# Patient Record
Sex: Female | Born: 1980
Health system: Southern US, Community
[De-identification: ages and names within clinical notes are randomized; demographics above are authoritative.]

## PROBLEM LIST (undated history)

## (undated) DIAGNOSIS — O350XX0 Maternal care for (suspected) central nervous system malformation in fetus, not applicable or unspecified: Secondary | ICD-10-CM

## (undated) DIAGNOSIS — G43109 Migraine with aura, not intractable, without status migrainosus: Secondary | ICD-10-CM

## (undated) DIAGNOSIS — G459 Transient cerebral ischemic attack, unspecified: Secondary | ICD-10-CM

## (undated) DIAGNOSIS — E785 Hyperlipidemia, unspecified: Secondary | ICD-10-CM

## (undated) HISTORY — DX: Hyperlipidemia, unspecified: E78.5

## (undated) HISTORY — DX: Migraine with aura, not intractable, without status migrainosus: G43.109

## (undated) HISTORY — DX: Maternal care for (suspected) central nervous system malformation in fetus, not applicable or unspecified: O35.0XX0

## (undated) HISTORY — DX: Transient cerebral ischemic attack, unspecified: G45.9

---
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
## 2005-01-31 ENCOUNTER — Ambulatory Visit (HOSPITAL_COMMUNITY): Admission: RE | Admit: 2005-01-31 | Discharge: 2005-01-31 | Payer: Self-pay | Admitting: *Deleted

## 2005-03-16 ENCOUNTER — Ambulatory Visit (HOSPITAL_COMMUNITY): Admission: RE | Admit: 2005-03-16 | Discharge: 2005-03-16 | Payer: Self-pay | Admitting: *Deleted

## 2005-08-17 ENCOUNTER — Ambulatory Visit: Payer: Self-pay | Admitting: Obstetrics & Gynecology

## 2005-08-22 ENCOUNTER — Ambulatory Visit: Payer: Self-pay | Admitting: Obstetrics and Gynecology

## 2005-08-22 ENCOUNTER — Inpatient Hospital Stay (HOSPITAL_COMMUNITY): Admission: AD | Admit: 2005-08-22 | Discharge: 2005-08-25 | Payer: Self-pay | Admitting: *Deleted

## 2008-05-25 ENCOUNTER — Emergency Department (HOSPITAL_COMMUNITY): Admission: EM | Admit: 2008-05-25 | Discharge: 2008-05-25 | Payer: Self-pay | Admitting: Emergency Medicine

## 2011-09-14 LAB — POCT I-STAT, CHEM 8
BUN: 15
Calcium, Ion: 1.15
Hemoglobin: 12.9
Sodium: 138
TCO2: 23

## 2011-09-14 LAB — POCT PREGNANCY, URINE: Operator id: 247071

## 2012-10-10 ENCOUNTER — Ambulatory Visit (INDEPENDENT_AMBULATORY_CARE_PROVIDER_SITE_OTHER): Payer: BLUE CROSS/BLUE SHIELD | Admitting: Family Medicine

## 2012-10-10 VITALS — BP 124/78 | HR 79 | Temp 98.0°F | Resp 17 | Ht 65.0 in | Wt 124.0 lb

## 2012-10-10 DIAGNOSIS — Z3009 Encounter for other general counseling and advice on contraception: Secondary | ICD-10-CM

## 2012-10-10 MED ORDER — NORETHINDRONE ACET-ETHINYL EST 1-20 MG-MCG PO TABS
1.0000 | ORAL_TABLET | Freq: Every day | ORAL | Status: DC
Start: 2012-10-10 — End: 2013-07-15

## 2012-10-10 NOTE — Progress Notes (Signed)
  Subjective:    Patient ID: Kelsey Anthony, female    DOB: 02-Aug-1981, 31 y.o.   MRN: [id number]  HPI  No h/o abnml paps. Was getting yearly and no h/o abnml so only needs every 3 yrs now as married in a monogamous relationship.  Last pap was 2 yrs ago and [facility]. Needs OCP refill today. No complaints or concerns.  History reviewed. No pertinent past medical history.   Review of Systems  Constitutional: Negative for fever, chills, diaphoresis, activity change, appetite change, fatigue and unexpected weight change.  Respiratory: Negative for shortness of breath.   Cardiovascular: Negative for chest pain and leg swelling.  Genitourinary: Negative for dysuria, vaginal discharge, genital sores, menstrual problem and pelvic pain.  Musculoskeletal: Negative for myalgias and joint swelling.      BP 124/78  Pulse 79  Temp 98 F (36.7 C) (Oral)  Resp 17  Ht 5\' 5"  (1.651 m)  Wt 124 lb (56.246 kg)  BMI 20.63 kg/m2  SpO2 98%  LMP 09/18/2012 Objective:   Physical Exam  Constitutional: She is oriented to person, place, and time. She appears well-developed and well-nourished. No distress.  HENT:  Head: Normocephalic and atraumatic.  Neck: Normal range of motion. Neck supple. No thyromegaly present.  Cardiovascular: Normal rate, regular rhythm and normal heart sounds.   Pulmonary/Chest: Effort normal and breath sounds normal. No respiratory distress.  Lymphadenopathy:    She has no cervical adenopathy.  Neurological: She is alert and oriented to person, place, and time.  Skin: Skin is warm and dry. She is not diaphoretic. No erythema.  Psychiatric: She has a normal mood and affect. Her behavior is normal.      Assessment & Plan:  1. Family planning - doing well on OCPs. Reviewed directions for use and side effects.  Pt will RTC in 1 yr for full CPEx w/ pap.

## 2013-07-15 ENCOUNTER — Encounter (HOSPITAL_COMMUNITY): Payer: Self-pay | Admitting: Emergency Medicine

## 2013-07-15 ENCOUNTER — Observation Stay (HOSPITAL_COMMUNITY)
Admission: EM | Admit: 2013-07-15 | Discharge: 2013-07-16 | Disposition: A | Discharge status: Home / Self Care | Payer: BC Managed Care – PPO | Attending: Internal Medicine | Admitting: Internal Medicine

## 2013-07-15 ENCOUNTER — Emergency Department (HOSPITAL_COMMUNITY): Payer: BC Managed Care – PPO

## 2013-07-15 DIAGNOSIS — H5460 Unqualified visual loss, one eye, unspecified: Secondary | ICD-10-CM | POA: Insufficient documentation

## 2013-07-15 DIAGNOSIS — H539 Unspecified visual disturbance: Secondary | ICD-10-CM

## 2013-07-15 DIAGNOSIS — R200 Anesthesia of skin: Secondary | ICD-10-CM

## 2013-07-15 DIAGNOSIS — H538 Other visual disturbances: Secondary | ICD-10-CM | POA: Insufficient documentation

## 2013-07-15 DIAGNOSIS — R209 Unspecified disturbances of skin sensation: Secondary | ICD-10-CM | POA: Insufficient documentation

## 2013-07-15 DIAGNOSIS — G459 Transient cerebral ischemic attack, unspecified: Principal | ICD-10-CM | POA: Insufficient documentation

## 2013-07-15 DIAGNOSIS — R946 Abnormal results of thyroid function studies: Secondary | ICD-10-CM | POA: Insufficient documentation

## 2013-07-15 DIAGNOSIS — G43109 Migraine with aura, not intractable, without status migrainosus: Secondary | ICD-10-CM | POA: Diagnosis present

## 2013-07-15 LAB — DIFFERENTIAL
Basophils Absolute: 0 10*3/uL (ref 0.0–0.1)
Eosinophils Absolute: 0.1 10*3/uL (ref 0.0–0.7)
Eosinophils Relative: 1 % (ref 0–5)
Lymphocytes Relative: 29 % (ref 12–46)
Lymphs Abs: 1.7 10*3/uL (ref 0.7–4.0)
Neutrophils Relative %: 58 % (ref 43–77)

## 2013-07-15 LAB — POCT I-STAT, CHEM 8
BUN: 9 mg/dL (ref 6–23)
Calcium, Ion: 1.2 mmol/L (ref 1.12–1.23)
Chloride: 107 mEq/L (ref 96–112)
Creatinine, Ser: 0.8 mg/dL (ref 0.50–1.10)

## 2013-07-15 LAB — COMPREHENSIVE METABOLIC PANEL
ALT: 15 U/L (ref 0–35)
AST: 21 U/L (ref 0–37)
Alkaline Phosphatase: 51 U/L (ref 39–117)
Calcium: 9.2 mg/dL (ref 8.4–10.5)
Potassium: 3.7 mEq/L (ref 3.5–5.1)
Sodium: 138 mEq/L (ref 135–145)
Total Protein: 7.3 g/dL (ref 6.0–8.3)

## 2013-07-15 LAB — URINALYSIS, ROUTINE W REFLEX MICROSCOPIC
Ketones, ur: NEGATIVE mg/dL
Leukocytes, UA: NEGATIVE
Nitrite: NEGATIVE
Protein, ur: NEGATIVE mg/dL
Urobilinogen, UA: 1 mg/dL (ref 0.0–1.0)

## 2013-07-15 LAB — POCT PREGNANCY, URINE: Preg Test, Ur: NEGATIVE

## 2013-07-15 LAB — CBC
MCH: 28.4 pg (ref 26.0–34.0)
MCV: 83.1 fL (ref 78.0–100.0)
Platelets: 272 10*3/uL (ref 150–400)
RBC: 4.37 MIL/uL (ref 3.87–5.11)
RDW: 13 % (ref 11.5–15.5)
WBC: 5.7 10*3/uL (ref 4.0–10.5)

## 2013-07-15 NOTE — ED Notes (Signed)
PT. REPORTS BRIEF EPISODE OF RIGHT FACIAL NUMBNESS AND RIGHT EYE BLURRED VISION WHILE DRIVING THIS EVENING , ALERT AND ORIENTED , SPEECH CLEAR , EQUAL STRONG GRIPS , NO ARM DRIFT /AMBULATORY.

## 2013-07-15 NOTE — ED Provider Notes (Signed)
CSN: [id number]     Arrival date & time 07/15/13  2111 History     First MD Initiated Contact with Patient 07/15/13 2240     Chief Complaint  Patient presents with  . Numbness   (Consider location/radiation/quality/duration/timing/severity/associated sxs/prior Treatment) The history is provided by the patient and medical records. No language interpreter was used.    Kelsey Anthony is a 32 y.o. female  with a no medical Hx presents to the Emergency Department complaining of gradual, resolved 30 second episode of right eye blindness with associated facial numbness approximately 7 PM.  Husband states that when he arrived home approximately 7:30 patient was "confused and out of it" with somewhat slurred speech with some stuttering and word finding difficulty.  Patient denies personal or family history of stroke.  Denies any previous episodes similar to this one. Nothing made the symptoms better or worse and they resolved completely and spontaneously just prior to arrival.  Pt denies fever, chills, headache, neck pain chest pain, shortness of breath, abdominal pain, nausea, vomiting, diarrhea, weakness, dizziness, syncope, dysuria, hematuria, weakness.  Denies numbness or tingling in any of her extremities, gait disturbance were noted facial droop.     History reviewed. No pertinent past medical history. History reviewed. No pertinent past surgical history. Family History  Problem Relation Age of Onset  . Depression Mother   . Depression Sister   . Heart disease Maternal Grandmother   . Cancer Maternal Grandfather   . Diabetes Paternal Grandmother    History  Substance Use Topics  . Smoking status: Never Smoker   . Smokeless tobacco: Never Used  . Alcohol Use: 0.6 oz/week    1 Glasses of wine per week   OB History   Grav Para Term Preterm Abortions TAB SAB Ect Mult Living                 Review of Systems  Constitutional: Negative for fever, diaphoresis, appetite change,  fatigue and unexpected weight change.  HENT: Negative for mouth sores and neck stiffness.   Eyes: Positive for visual disturbance.  Respiratory: Negative for cough, chest tightness, shortness of breath and wheezing.   Cardiovascular: Negative for chest pain.  Gastrointestinal: Negative for nausea, vomiting, abdominal pain, diarrhea and constipation.  Endocrine: Negative for polydipsia, polyphagia and polyuria.  Genitourinary: Negative for dysuria, urgency, frequency and hematuria.  Musculoskeletal: Negative for back pain.  Skin: Negative for rash.  Allergic/Immunologic: Negative for immunocompromised state.  Neurological: Positive for numbness. Negative for syncope, light-headedness and headaches.  Hematological: Does not bruise/bleed easily.  Psychiatric/Behavioral: Negative for sleep disturbance. The patient is not nervous/anxious.     Allergies  Amoxicillin and Penicillins  Home Medications   Current Outpatient Rx  Name  Route  Sig  Dispense  Refill  . acetaminophen (TYLENOL) 325 MG tablet   Oral   Take 325 mg by mouth every 6 (six) hours as needed for pain.         Marland Kitchen norethindrone-ethinyl estradiol (JUNEL FE,GILDESS FE,LOESTRIN FE) 1-20 MG-MCG tablet   Oral   Take 1 tablet by mouth daily.          BP 144/83  Pulse 80  Temp(Src) 98 F (36.7 C) (Oral)  Resp 17  SpO2 99% Physical Exam  Nursing note and vitals reviewed. Constitutional: She is oriented to person, place, and time. She appears well-developed and well-nourished. No distress.  Awake, alert, nontoxic appearance  HENT:  Head: Normocephalic and atraumatic.  Right Ear: Hearing, tympanic membrane,  external ear and ear canal normal.  Left Ear: Hearing, tympanic membrane, external ear and ear canal normal.  Nose: Nose normal. No mucosal edema.  Mouth/Throat: Uvula is midline, oropharynx is clear and moist and mucous membranes are normal. Mucous membranes are not dry. No edematous. No oropharyngeal exudate,  posterior oropharyngeal edema, posterior oropharyngeal erythema or tonsillar abscesses.  Eyes: Conjunctivae and EOM are normal. Pupils are equal, round, and reactive to light. Right conjunctiva is not injected. Left conjunctiva is not injected. No scleral icterus.  Fundoscopic exam:      The right eye shows no arteriolar narrowing, no AV nicking, no exudate, no hemorrhage and no papilledema.       The left eye shows no arteriolar narrowing, no AV nicking, no exudate, no hemorrhage and no papilledema.  Neck: Normal range of motion, full passive range of motion without pain and phonation normal. Neck supple. No spinous process tenderness present. No rigidity. Normal range of motion present.  Cardiovascular: Normal rate, regular rhythm, S1 normal, S2 normal, normal heart sounds and intact distal pulses.   No murmur heard. Pulses:      Radial pulses are 2+ on the right side, and 2+ on the left side.       Dorsalis pedis pulses are 2+ on the right side, and 2+ on the left side.       Posterior tibial pulses are 2+ on the right side, and 2+ on the left side.  Pulmonary/Chest: Effort normal and breath sounds normal. No stridor. No respiratory distress. She has no wheezes. She has no rales. She exhibits no tenderness.  Abdominal: Soft. Bowel sounds are normal. She exhibits no distension and no mass. There is no tenderness. There is no rebound and no guarding.  Musculoskeletal: Normal range of motion. She exhibits no edema.  Neurological: She is alert and oriented to person, place, and time. She has normal reflexes. No cranial nerve deficit. She exhibits normal muscle tone. Coordination normal.  Speech is clear and goal oriented, follows commands Major Cranial nerves without deficit, no facial droop Normal strength in upper and lower extremities bilaterally including dorsiflexion and plantar flexion, strong and equal grip strength Sensation normal to light and sharp touch Moves extremities without ataxia,  coordination intact Normal finger to nose and rapid alternating movements Neg romberg, no pronator drift Normal gait and balance  Skin: Skin is warm and dry. No rash noted. She is not diaphoretic. No erythema.  Psychiatric: She has a normal mood and affect.    ED Course   Procedures (including critical care time)  Labs Reviewed  COMPREHENSIVE METABOLIC PANEL - Abnormal; Notable for the following:    Glucose, Bld 119 (*)    Total Bilirubin 0.2 (*)    All other components within normal limits  POCT I-STAT, CHEM 8 - Abnormal; Notable for the following:    Glucose, Bld 120 (*)    All other components within normal limits  CBC  DIFFERENTIAL  URINE RAPID DRUG SCREEN (HOSP PERFORMED)  URINALYSIS, ROUTINE W REFLEX MICROSCOPIC  PROTIME-INR  APTT  POCT PREGNANCY, URINE   Ct Head (brain) Wo Contrast  07/15/2013   *RADIOLOGY REPORT*  Clinical Data: Right facial numbness  CT HEAD WITHOUT CONTRAST  Technique:  Contiguous axial images were obtained from the base of the skull through the vertex without contrast.  Comparison: None.  Findings: The calvarium is intact.  No soft tissue abnormality is seen.  The ventricles are of normal size and configuration. No findings to suggest  acute hemorrhage, acute infarction or space- occupying mass lesion are noted.  IMPRESSION: No acute abnormality is seen.   Original Report Authenticated By: Alcide Clever, M.D.   1. Visual disturbance   2. Numbness and tingling of right face     MDM  Terie P Brower presents with symptoms of blurred vision and facial numbness with possibly slurred speech without a disturbance or weakness lasting less than 10 minutes. Concern for possible TIA. ABCD2 score was only 1 point; patient is low risk.  CT head negative; PT/INR and aPTT without evidence of coagulation disorder, urgency test negative, urine drug screen negative, urinalysis evidence of urinary tract infection; CBC and CMP unremarkable.  Patient will need MRI of  brain to rule out optic nerve lesions, new onset EMS or CVA.  Will admit for observation and overnight TIA workup.    Dr. Preston Fleeting was consulted, evaluated this patient with me and agrees with the plan.     Dahlia Client Nayana Lenig, PA-C 07/16/13 0201

## 2013-07-15 NOTE — ED Notes (Addendum)
I-STAT CHEM 8  Na                                       142 mmo I/L K                                          3.7 mmo I/L Cl                                        107 mmo I/L iCa                                     1.20 mmo I/L TCO2                                   22 mmo I/L  Glu                                    120 mg/dL BUN                                      9 mg/dL Crea                                   0.8 mg/dL Hct                                      36 %PCV Hb*                                  12.2  g/dL     *via Hct AnGap                              18 mmo I/L

## 2013-07-16 ENCOUNTER — Encounter (HOSPITAL_COMMUNITY): Payer: Self-pay | Admitting: Internal Medicine

## 2013-07-16 ENCOUNTER — Observation Stay (HOSPITAL_COMMUNITY): Payer: BC Managed Care – PPO

## 2013-07-16 DIAGNOSIS — R209 Unspecified disturbances of skin sensation: Secondary | ICD-10-CM

## 2013-07-16 DIAGNOSIS — H539 Unspecified visual disturbance: Secondary | ICD-10-CM

## 2013-07-16 DIAGNOSIS — G43109 Migraine with aura, not intractable, without status migrainosus: Secondary | ICD-10-CM | POA: Diagnosis present

## 2013-07-16 DIAGNOSIS — G459 Transient cerebral ischemic attack, unspecified: Secondary | ICD-10-CM | POA: Diagnosis present

## 2013-07-16 DIAGNOSIS — R200 Anesthesia of skin: Secondary | ICD-10-CM | POA: Diagnosis present

## 2013-07-16 LAB — LIPID PANEL
HDL: 52 mg/dL (ref >39)
Total CHOL/HDL Ratio: 3.3 RATIO
VLDL: 15 mg/dL (ref 0–40)

## 2013-07-16 LAB — RAPID URINE DRUG SCREEN, HOSP PERFORMED
Amphetamines: NOT DETECTED
Barbiturates: NOT DETECTED
Benzodiazepines: NOT DETECTED
Tetrahydrocannabinol: NOT DETECTED

## 2013-07-16 LAB — GLUCOSE, CAPILLARY: Glucose-Capillary: 88 mg/dL (ref 70–99)

## 2013-07-16 LAB — TSH: TSH: 6.401 u[IU]/mL — ABNORMAL HIGH (ref 0.350–4.500)

## 2013-07-16 LAB — HEMOGLOBIN A1C: Mean Plasma Glucose: 91 mg/dL (ref <117)

## 2013-07-16 LAB — HOMOCYSTEINE: Homocysteine: 11.1 umol/L (ref 4.0–15.4)

## 2013-07-16 MED ORDER — ASPIRIN 325 MG PO TABS
325.0000 mg | ORAL_TABLET | Freq: Every day | ORAL | Status: DC
  Administered 2013-07-16: 325 mg via ORAL
  Filled 2013-07-16: qty 1

## 2013-07-16 MED ORDER — SIMVASTATIN 20 MG PO TABS
20.0000 mg | ORAL_TABLET | Freq: Every day | ORAL | Status: DC
  Filled 2013-07-16: qty 1

## 2013-07-16 MED ORDER — SIMVASTATIN 20 MG PO TABS: 20.0000 mg | ORAL_TABLET | Freq: Every day | ORAL | Status: DC

## 2013-07-16 MED ORDER — ONDANSETRON HCL 4 MG/2ML IJ SOLN: 4.0000 mg | Freq: Three times a day (TID) | INTRAMUSCULAR | Status: AC | PRN

## 2013-07-16 MED ORDER — ASPIRIN 81 MG PO CHEW: 81.0000 mg | CHEWABLE_TABLET | Freq: Every day | ORAL | Status: DC

## 2013-07-16 MED ORDER — ENOXAPARIN SODIUM 40 MG/0.4ML ~~LOC~~ SOLN
40.0000 mg | SUBCUTANEOUS | Status: DC
  Administered 2013-07-16: 40 mg via SUBCUTANEOUS
  Filled 2013-07-16: qty 0.4

## 2013-07-16 NOTE — H&P (Addendum)
[facility] History and Physical  Kelsey Anthony ZOX:[id number] DOB: Oct 25, 1981 DOA: 07/15/2013  Referring physician: EDP PCP: No PCP Per Patient  Specialists:   Chief Complaint:   HPI: Kelsey Anthony is a 32 y.o. female who began to have sudden right sided facial numbness and loss of vision in her right eye as well as problems with her speech, while she was driving at 7 pm.   She reports that those symptom lasted about 30 seconds and then resolved, but  She continued to have blurry vision and confusion that lasted about 30 minutes.   She reports despite her symptoms she was able to continue to drive herself home and when she got home her husband observed that she had a dazed look and was confused so he had her brought to the ED for evaluation.  She denied having any chest pain or headache, but she did report feeling faint.  In the ED she had a CT scan of the Brain performed which was negative for acute findings, and she was referred for admission for further evaluation.      Review of Systems: The patient denies anorexia, fever, chills, headaches, weight lossdecreased hearing, rhinitis, hoarseness, chest pain, syncope, dyspnea on exertion, peripheral edema, balance deficits, cough, hemoptysis, abdominal pain, nausea, vomiting, diarrhea, constipation, hematemesis, melena, hematochezia, severe indigestion/heartburn, dysuria, hematuria, incontinence, muscle weakness, suspicious skin lesions, transient blindness, difficulty walking, depression, unusual weight change, abnormal bleeding, enlarged lymph nodes, angioedema, and breast masses.    History reviewed. No pertinent past medical history.    History reviewed. No pertinent past surgical history.     Prior to Admission medications   Medication Sig Start Date End Date Taking? Authorizing Provider  acetaminophen (TYLENOL) 325 MG tablet Take 325 mg by mouth every 6 (six) hours as needed for pain.   Yes Historical Provider, MD   norethindrone-ethinyl estradiol (JUNEL FE,GILDESS FE,LOESTRIN FE) 1-20 MG-MCG tablet Take 1 tablet by mouth daily.   Yes Historical Provider, MD    Allergies  Allergen Reactions  . Amoxicillin Rash  . Penicillins Rash    Social History:  reports that she has never smoked. She has never used smokeless tobacco. She reports that she drinks about 0.6 ounces of alcohol per week. She reports that she does not use illicit drugs.     Family History  Problem Relation Age of Onset  . Depression Mother   . Depression Sister   . Heart disease Maternal Grandmother   . Cancer Maternal Grandfather   . Diabetes Paternal Grandmother   . Diabetes Maternal Grandmother   . Hypertension Father        Physical Exam:  GEN:  Pleasant Well Nourished and Well developed  32 y.o. Caucasian  female  examined  and in no acute distress; cooperative with exam Filed Vitals:   07/16/13 0115 07/16/13 0130 07/16/13 0145 07/16/13 0250  BP: 93/39 113/72 116/79 109/67  Pulse: 77 79 87 76  Temp:    98.9 F (37.2 C)  TempSrc:    Oral  Resp: 19 15 16 16   Height:    5\' 7"  (1.702 m)  Weight:    56.473 kg (124 lb 8 oz)  SpO2: 100% 100% 99% 98%   Blood pressure 109/67, pulse 76, temperature 98.9 F (37.2 C), temperature source Oral, resp. rate 16, height 5\' 7"  (1.702 m), weight 56.473 kg (124 lb 8 oz), SpO2 98.00%. PSYCH: She is alert and oriented x4; does not appear anxious does not appear depressed; affect  is normal HEENT: Normocephalic and Atraumatic, Mucous membranes pink; PERRLA; EOM intact; Fundi:  Benign;  No scleral icterus, Nares: Patent, Oropharynx: Clear, Fair Dentition, Neck:  FROM, no cervical lymphadenopathy nor thyromegaly or carotid bruit; no JVD; Breasts:: Not examined CHEST WALL: No tenderness CHEST: Normal respiration, clear to auscultation bilaterally HEART: Regular rate and rhythm; no murmurs rubs or gallops BACK: No kyphosis or scoliosis; no CVA tenderness ABDOMEN: Positive Bowel Sounds,  soft non-tender; no masses, no organomegaly.    Rectal Exam: Not done EXTREMITIES: No cyanosis, clubbing or edema; no ulcerations. Genitalia: not examined PULSES: 2+ and symmetric SKIN: Normal hydration no rash or ulceration CNS: Cranial nerves 2-12 grossly intact,  No Motor or Sensory Deficits,  Cerebellar Fxn: Intact, and Able to move All 4 Exts without difficulty, No pronator Drift ,   Gait was not Assessed.      Labs on Admission:  Basic Metabolic Panel:  Recent Labs Lab 07/15/13 2131 07/15/13 2140  NA 138 142  K 3.7 3.7  CL 105 107  CO2 22  --   GLUCOSE 119* 120*  BUN 10 9  CREATININE 0.79 0.80  CALCIUM 9.2  --    Liver Function Tests:  Recent Labs Lab 07/15/13 2131  AST 21  ALT 15  ALKPHOS 51  BILITOT 0.2*  PROT 7.3  ALBUMIN 3.8   No results found for this basename: LIPASE, AMYLASE,  in the last 168 hours No results found for this basename: AMMONIA,  in the last 168 hours CBC:  Recent Labs Lab 07/15/13 2131 07/15/13 2140  WBC 5.7  --   NEUTROABS 3.3  --   HGB 12.4 12.2  HCT 36.3 36.0  MCV 83.1  --   PLT 272  --    Cardiac Enzymes: No results found for this basename: CKTOTAL, CKMB, CKMBINDEX, TROPONINI,  in the last 168 hours  BNP (last 3 results) No results found for this basename: PROBNP,  in the last 8760 hours CBG: No results found for this basename: GLUCAP,  in the last 168 hours  Radiological Exams on Admission: Ct Head (brain) Wo Contrast  07/15/2013   *RADIOLOGY REPORT*  Clinical Data: Right facial numbness  CT HEAD WITHOUT CONTRAST  Technique:  Contiguous axial images were obtained from the base of the skull through the vertex without contrast.  Comparison: None.  Findings: The calvarium is intact.  No soft tissue abnormality is seen.  The ventricles are of normal size and configuration. No findings to suggest acute hemorrhage, acute infarction or space- occupying mass lesion are noted.  IMPRESSION: No acute abnormality is seen.   Original  Report Authenticated By: Alcide Clever, M.D.      Assessment/Plan Principal Problem:   TIA (transient ischemic attack) Active Problems:   Numbness and tingling of right face   Visual disturbance  1.   TIA- Admitted for TIA workup, Neuro Checks and MRI/MRA of Brain in AM along with CArotid US, and 2D ECHO.   ASA therapy and Check TSH and Fasting Lipids.    2.   Numbness and Tingling Right Face-  See #1.   3.  Visual Disturbance-  Same a s #1, Doubt that it is optic Neuritis due to transience.    4.  DVT prophylaxis with Lovenox.         Code Status:   FULL CODE Family Communication:   Husband at Bedside  Disposition Plan:       Return to Home on Discharge  Time spent: 7 Minutes  Ron Parker [facility] Pager [phone]  If 7PM-7AM, please contact night-coverage [email or web address] Password [email or web address] 07/16/2013, 4:06 AM

## 2013-07-16 NOTE — ED Provider Notes (Signed)
Medical screening examination/treatment/procedure(s) were performed by non-physician practitioner and as supervising physician I was immediately available for consultation/collaboration.    Christopher J. Pollina, MD 07/16/13 1545 

## 2013-07-16 NOTE — Progress Notes (Signed)
  Echocardiogram 2D Echocardiogram has been performed.  Cathie Beams 07/16/2013, 4:06 PM

## 2013-07-16 NOTE — ED Provider Notes (Signed)
32 year old female had an episode where she lost vision in her right eye. Loss of vision lasted less than a minute. This was associated with some numbness in the infraorbital area on the right. Shortly after that, she noticed confusion and when she talked with her husband, he noticed that her speech was stuttering and she seemed to have difficulty finding words. This also resolved after a relatively short amount of time. She denies headache. Denies similar episodes in the past. She has no history of previous neurologic problems. On exam, pupils are equal and reactive, fundi are normal, speech is normal, cranial nerves are intact, there no motor or sensory deficits. The vision loss could conceivably be an episode of optic neuritis as an initial presentation of multiple sclerosis but is not typical of that. This could be a transient ischemic attack although she is young and without significant risk factors for TIA. She will be admitted for TIA evaluation and MRI would also show if there any plaques suggestive of early multiple sclerosis.  Medical screening examination/treatment/procedure(s) were conducted as a shared visit with non-physician practitioner(s) and myself.  I personally evaluated the patient during the encounter   Dione Booze, MD 07/16/13 [phone]

## 2013-07-16 NOTE — Progress Notes (Signed)
*  PRELIMINARY RESULTS* Vascular Ultrasound Carotid Duplex (Doppler) has been completed.  Preliminary findings: Bilateral:  Less than 39% ICA stenosis.  Vertebral artery flow is antegrade.      Farrel Demark, RDMS, RVT  07/16/2013, 3:12 PM

## 2013-07-16 NOTE — Progress Notes (Signed)
Patient seen and examined, admitted by Dr. Lovell Sheehan this morning, [age]-year-old female presented with sudden right-sided facial numbness and vision in her right eye, disorientation. Loss of vision was resolved within a minute. - H&P reviewed, agree with assessment and plan - MRI/MRA negative for any stroke/acute CVA - 2-D echo, carotid Dopplers pending.    Tanja Gift M.D. [facility] 07/16/2013, 12:52 PM  Pager: [phone]

## 2013-07-16 NOTE — Discharge Summary (Signed)
Physician Discharge Summary  Patient ID: Kelsey Anthony MRN: [id number] DOB/AGE: 07/19/1981 32 y.o.  Admit date: 07/15/2013 Discharge date: 07/16/2013  Primary Care Physician:  No PCP Per Patient  Discharge Diagnoses:    . TIA (transient ischemic attack) . Numbness and tingling of right face . Visual disturbance Elevated TSH  Consults:  None   Recommendations for Outpatient Follow-up:  Please follow T3, T4 and LFTs. Patient was started on statins.  Allergies:   Allergies  Allergen Reactions  . Amoxicillin Rash  . Penicillins Rash     Discharge Medications:   Medication List         acetaminophen 325 MG tablet  Commonly known as:  TYLENOL  Take 325 mg by mouth every 6 (six) hours as needed for pain.     aspirin 81 MG chewable tablet  Chew 1 tablet (81 mg total) by mouth daily.     norethindrone-ethinyl estradiol 1-20 MG-MCG tablet  Commonly known as:  JUNEL FE,GILDESS FE,LOESTRIN FE  Take 1 tablet by mouth daily.     simvastatin 20 MG tablet  Commonly known as:  ZOCOR  Take 1 tablet (20 mg total) by mouth at bedtime.         Brief H and P: For complete details please refer to admission H and P, but in brief Kelsey Anthony is a 32 y.o. female who began to have sudden right sided facial numbness and loss of vision in her right eye as well as problems with her speech, while she was driving at 7 pm. She reported that those symptom lasted about 30 seconds and then resolved, but She continued to have blurry vision and confusion that lasted about 30 minutes. She reports despite her symptoms she was able to continue to drive herself home and when she got home her husband observed that she had a dazed look and was confused so he had her brought to the ED for evaluation. CT head was negative for acute CVA, patient was admitted for further evaluation.   Hospital Course:     TIA (transient ischemic attack)- symptoms completely resolved. CT head was negative for  acute CVA or any intracranial bleed. Patient underwent MRI of the brain which was negative for acute infarct, negative for chronic ischemia or demyelinating disease. MRA brain was negative for any stenosis or thrombus in any intracranial vessels. Carotid Dopplers showed less than 39% ICA stenosis bilateral. 2-D echo showed EF of 60-65%, no regional wall motion abnormalities. Lipid panel showed cholesterol 171, LDL 104, patient was started on statins. Hypercoagulable panel was sent, results pending Patient was not on aspirin prior to admission, was started on aspirin 81 mg daily. She did not need any PT, OT, speech evaluation as patient was in the waiting room without any difficulty, had no speech or swallowing difficulty. Patient was completely back to her baseline, confirmed by husband in the room.  Elevated TSH: TSH was mildly elevated at 6.4. T3 and free T4 were ordered and results are pending. The patient denied any symptoms of hypothyroidism. Followup on her TSH in next 3 months, if still trending up, may need to start on low-dose of Synthroid.  Day of Discharge BP 112/64  Pulse 77  Temp(Src) 98.7 F (37.1 C) (Oral)  Resp 17  Ht 5\' 7"  (1.702 m)  Wt 56.473 kg (124 lb 8 oz)  BMI 19.49 kg/m2  SpO2 100%  Physical Exam: General: Alert and awake oriented x3 not in any acute distress. HEENT: anicteric sclera,  pupils reactive to light and accommodation CVS: S1-S2 clear no murmur rubs or gallops Chest: clear to auscultation bilaterally, no wheezing rales or rhonchi Abdomen: soft nontender, nondistended, normal bowel sounds, no organomegaly Extremities: no cyanosis, clubbing or edema noted bilaterally Neuro: Cranial nerves II-XII intact, no focal neurological deficits   The results of significant diagnostics from this hospitalization (including imaging, microbiology, ancillary and laboratory) are listed below for reference.    LAB RESULTS: Basic Metabolic Panel:  Recent Labs Lab  07/15/13 2131 07/15/13 2140  NA 138 142  K 3.7 3.7  CL 105 107  CO2 22  --   GLUCOSE 119* 120*  BUN 10 9  CREATININE 0.79 0.80  CALCIUM 9.2  --    Liver Function Tests:  Recent Labs Lab 07/15/13 2131  AST 21  ALT 15  ALKPHOS 51  BILITOT 0.2*  PROT 7.3  ALBUMIN 3.8   No results found for this basename: LIPASE, AMYLASE,  in the last 168 hours No results found for this basename: AMMONIA,  in the last 168 hours CBC:  Recent Labs Lab 07/15/13 2131 07/15/13 2140  WBC 5.7  --   NEUTROABS 3.3  --   HGB 12.4 12.2  HCT 36.3 36.0  MCV 83.1  --   PLT 272  --    Cardiac Enzymes: No results found for this basename: CKTOTAL, CKMB, CKMBINDEX, TROPONINI,  in the last 168 hours BNP: No components found with this basename: POCBNP,  CBG:  Recent Labs Lab 07/16/13 0904 07/16/13 1223  GLUCAP 88 96    Significant Diagnostic Studies:  Ct Head (brain) Wo Contrast  07/15/2013   *RADIOLOGY REPORT*  Clinical Data: Right facial numbness  CT HEAD WITHOUT CONTRAST  Technique:  Contiguous axial images were obtained from the base of the skull through the vertex without contrast.  Comparison: None.  Findings: The calvarium is intact.  No soft tissue abnormality is seen.  The ventricles are of normal size and configuration. No findings to suggest acute hemorrhage, acute infarction or space- occupying mass lesion are noted.  IMPRESSION: No acute abnormality is seen.   Original Report Authenticated By: Alcide Clever, M.D.   Mri Brain Without Contrast  07/16/2013   *RADIOLOGY REPORT*  Clinical Data:  TIA.  Right facial numbness and vision loss. Speech problems.  MRI HEAD WITHOUT CONTRAST MRA HEAD WITHOUT CONTRAST  Technique:  Multiplanar, multiecho pulse sequences of the brain and surrounding structures were obtained without intravenous contrast. Angiographic images of the head were obtained using MRA technique without contrast.  Comparison:  CT 07/15/2013  MRI HEAD  Findings:  Ventricle size is  normal.  Negative for Chiari malformation.  Pituitary normal in size.  Negative for acute infarct.  Negative for chronic ischemia or demyelinating disease.  Cerebral white matter and brainstem are normal. Negative for hemorrhage or mass lesion.  No edema or shift to the midline structures. Mucous retention cyst left maxillary sinus.  IMPRESSION: Normal MRI of the brain  Mucous retention cyst left maxillary sinus.  MRA HEAD  Findings: Both vertebral arteries are patent to the basilar.  PICA is patent bilaterally.  Superior cerebellar and posterior cerebral arteries are patent bilaterally.  Posterior communicating artery is patent bilaterally.  Internal carotid artery widely patent bilaterally.  Anterior and middle cerebral arteries widely patent without stenosis.  Negative for cerebral aneurysm.  IMPRESSION: Negative   Original Report Authenticated By: Janeece Riggers, M.D.   Mr Mra Head/brain Wo Cm  07/16/2013   *RADIOLOGY REPORT*  Clinical  Data:  TIA.  Right facial numbness and vision loss. Speech problems.  MRI HEAD WITHOUT CONTRAST MRA HEAD WITHOUT CONTRAST  Technique:  Multiplanar, multiecho pulse sequences of the brain and surrounding structures were obtained without intravenous contrast. Angiographic images of the head were obtained using MRA technique without contrast.  Comparison:  CT 07/15/2013  MRI HEAD  Findings:  Ventricle size is normal.  Negative for Chiari malformation.  Pituitary normal in size.  Negative for acute infarct.  Negative for chronic ischemia or demyelinating disease.  Cerebral white matter and brainstem are normal. Negative for hemorrhage or mass lesion.  No edema or shift to the midline structures. Mucous retention cyst left maxillary sinus.  IMPRESSION: Normal MRI of the brain  Mucous retention cyst left maxillary sinus.  MRA HEAD  Findings: Both vertebral arteries are patent to the basilar.  PICA is patent bilaterally.  Superior cerebellar and posterior cerebral arteries are patent  bilaterally.  Posterior communicating artery is patent bilaterally.  Internal carotid artery widely patent bilaterally.  Anterior and middle cerebral arteries widely patent without stenosis.  Negative for cerebral aneurysm.  IMPRESSION: Negative   Original Report Authenticated By: Janeece Riggers, M.D.    2D ECHO: Conclusions  Left ventricle: The cavity size was normal. Systolic function was normal. The estimated ejection fraction was in the range of 60% to 65%. Wall motion was normal; there were no regional wall motion abnormalities.    Disposition and Follow-up:     Discharge Orders   Future Orders Complete By Expires     Diet - low sodium heart healthy  As directed     Increase activity slowly  As directed         DISPOSITION: Home DIET: Heart healthy diet ACTIVITY: As tolerated   DISCHARGE FOLLOW-UP  Patient will follow at [facility] for establishing primary care physician.  Follow-up Information   Follow up with Gates Rigg, MD. Schedule an appointment as soon as possible for a visit in 2 months. (for TIA follow-up)    Contact information:   [address] [place] [address] [phone]       Time spent on Discharge: 35 minutes  Signed:   RAI,RIPUDEEP M.D. [facility] 07/16/2013, 4:18 PM Pager: [phone]

## 2013-07-17 LAB — BETA-2-GLYCOPROTEIN I ABS, IGG/M/A: Beta-2-Glycoprotein I IgA: 1 A Units (ref <20)

## 2013-07-17 LAB — CARDIOLIPIN ANTIBODIES, IGG, IGM, IGA: Anticardiolipin IgG: 4 GPL U/mL — ABNORMAL LOW (ref <23)

## 2013-07-18 DIAGNOSIS — G43109 Migraine with aura, not intractable, without status migrainosus: Secondary | ICD-10-CM

## 2013-07-18 HISTORY — DX: Migraine with aura, not intractable, without status migrainosus: G43.109

## 2013-07-18 LAB — PROTEIN S ACTIVITY: Protein S Activity: 99 % (ref 69–129)

## 2013-07-18 LAB — LUPUS ANTICOAGULANT PANEL

## 2013-07-18 LAB — PROTHROMBIN GENE MUTATION

## 2013-08-05 ENCOUNTER — Emergency Department (HOSPITAL_COMMUNITY)
Admission: EM | Admit: 2013-08-05 | Discharge: 2013-08-05 | Disposition: A | Discharge status: Home / Self Care | Payer: BC Managed Care – PPO | Source: Home / Self Care | Attending: Emergency Medicine | Admitting: Emergency Medicine

## 2013-08-05 ENCOUNTER — Encounter (HOSPITAL_COMMUNITY): Payer: Self-pay | Admitting: *Deleted

## 2013-08-05 DIAGNOSIS — G43809 Other migraine, not intractable, without status migrainosus: Secondary | ICD-10-CM

## 2013-08-05 DIAGNOSIS — G43109 Migraine with aura, not intractable, without status migrainosus: Secondary | ICD-10-CM

## 2013-08-05 NOTE — ED Notes (Signed)
Pt  Was    Seen    sev  Weeks     Ago     And  Had     Lots  Of tests  Done  Which     Was  [place]        -  Was  Given a  Working    Diagnosis  Of  tia   And  Was  Told  To  Take  Asa      -              Pt  Was  At  Work   At  [employer]    And  20  mins  Ago     Had  Episode     On  problems  Focusing  And  Had     Got  Better    On  Way  To  Clinic  And        Feels  Fine          At  This  Time

## 2013-08-05 NOTE — ED Provider Notes (Signed)
Chief Complaint:   Chief Complaint  Patient presents with  . Eye Problem    History of Present Illness:   Kelsey Anthony is a 32 year old female who presents today because she had an episode of blurry vision and zigzag lines in the left, temporal visual field of both eyes. This occurred a couple of hours before presentation here and lasted about 15 minutes. She denies any diplopia, headache, fever, or stiff neck. She did not have any numbness, tingling, weakness, or difficulty with speech, swallowing, ambulation, coordination, balance, or dizziness.  She had an episode about 2 weeks ago where she lost her vision completely in her right eye, this was accompanied by right facial numbness, disorientation, and stuttering speech. She was hospitalized for TIA workup at [facility] which was entirely negative. She was placed on aspirin and Zocor. She has not had an episode like this since then or prior to this. She's not followed up with her primary care physician or neurologist. She is on birth control pills. She denies any history of migraine headaches.  Review of Systems:  Other than noted above, the patient denies any of the following symptoms: Systemic:  No fever, chills, fatigue, photophobia, stiff neck. Eye:  No redness, eye pain, discharge, blurred vision, or diplopia. ENT:  No nasal congestion, rhinorrhea, sinus pressure or pain, sneezing, earache, or sore throat.  No jaw claudication. Neuro:  No paresthesias, loss of consciousness, seizure activity, muscle weakness, trouble with coordination or gait, trouble speaking or swallowing. Psych:  No depression, anxiety or trouble sleeping.  PMFSH:  Past medical history, family history, social history, meds, and allergies were reviewed.  She's allergic to penicillin.  Physical Exam:   Vital signs:  BP 146/97  Pulse 100  Temp(Src) 98 F (36.7 C) (Oral)  Resp 16  SpO2 100% General:  Alert and oriented.  In no distress. Eye:  Lids and  conjunctivas normal.  PERRL,  Full EOMs.  Fundi benign with normal discs and vessels. ENT:  No cranial or facial tenderness to palpation.  TMs and canals clear.  Nasal mucosa was normal and uncongested without any drainage. No intra oral lesions, pharynx clear, mucous membranes moist, dentition normal. Neck:  Supple, full ROM, no tenderness to palpation.  No adenopathy or mass. No carotid bruit. Lungs: Clear to auscultation. Heart: Regular rhythm, no gallop or murmur. Neuro:  Alert and orented times 3.  Speech was clear, fluent, and appropriate.  Cranial nerves intact. No pronator drift, muscle strength normal. Finger to nose normal.  DTRs were 2+ and symmetrical.Station and gait were normal.  Romberg's sign was normal.  Able to perform tandem gait well. Psych:  Normal affect.  Assessment:  The encounter diagnosis was Ocular migraine.  I think she may have had a TIA 2 weeks ago, but the episode today sounds more like an ocular migraine. I think the best for her to stop her birth control pills. I suggested she followup with neurology and continue the aspirin. Return if any further neurological symptoms.  Plan:   1.  The following meds were prescribed:   Discharge Medication List as of 08/05/2013  3:09 PM     2.  The patient was instructed in symptomatic care and handouts were given. 3.  The patient was told to return if becoming worse in any way, if no better in 3 or 4 days, and given some red flag symptoms such as any new neurological or visual symptoms that would indicate earlier return. 4.  Follow  up with Dr. Porfirio Mylar Dohmeier as soon as possible.    Reuben Likes, MD 08/05/13 2258

## 2013-09-04 ENCOUNTER — Encounter: Payer: Self-pay | Admitting: *Deleted

## 2013-09-04 ENCOUNTER — Encounter: Payer: Self-pay | Admitting: Family Medicine

## 2013-09-04 ENCOUNTER — Ambulatory Visit (INDEPENDENT_AMBULATORY_CARE_PROVIDER_SITE_OTHER): Payer: BC Managed Care – PPO | Admitting: Family Medicine

## 2013-09-04 VITALS — BP 128/84 | HR 80 | Temp 98.3°F | Ht 66.0 in | Wt 123.5 lb

## 2013-09-04 DIAGNOSIS — E038 Other specified hypothyroidism: Secondary | ICD-10-CM

## 2013-09-04 DIAGNOSIS — Z0000 Encounter for general adult medical examination without abnormal findings: Secondary | ICD-10-CM

## 2013-09-04 DIAGNOSIS — Z01419 Encounter for gynecological examination (general) (routine) without abnormal findings: Secondary | ICD-10-CM

## 2013-09-04 DIAGNOSIS — E039 Hypothyroidism, unspecified: Secondary | ICD-10-CM

## 2013-09-04 DIAGNOSIS — H539 Unspecified visual disturbance: Secondary | ICD-10-CM

## 2013-09-04 DIAGNOSIS — G459 Transient cerebral ischemic attack, unspecified: Secondary | ICD-10-CM

## 2013-09-04 NOTE — Assessment & Plan Note (Signed)
Check TSH, Free T4 and T3 today.  Per pt longstanding h/o borderline elevated TSH.

## 2013-09-04 NOTE — Patient Instructions (Signed)
Pass by Kelsey Anthony's office to schedule appointment with neurologist and with OBGYN for well woman exam. Blood work today to recheck thyroid. Good to meet you today! Call us with quesitons.

## 2013-09-04 NOTE — Assessment & Plan Note (Signed)
Will refer to neurology for further assistance with future management.  ?recent TIA - if so would continue statin and asa.  Unrevealing w/u in hospital.  ?need bubble study. Pt would prefer to stop statin as considering second pregnancy with husband.   rec see neuro first, use condoms for now. Off OCP. Will also refer to OBGYN for well woman exam per patient preference.

## 2013-09-04 NOTE — Assessment & Plan Note (Addendum)
Again will refer to neuro for opinion on recent visual disturbance diagnosed as ocular migraine by [facility]. No recurrence.

## 2013-09-04 NOTE — Progress Notes (Signed)
Subjective:    Patient ID: Kelsey Anthony, female    DOB: 29-Jun-1981, 32 y.o.   MRN: [id number]  HPI CC: new pt to establish  Kelsey Anthony is a pleasant 32 yo with h/o ?TIA then ocular migraine who presents to establish care today.  TIA presented as R blurry vision, R facial numbness, and confusion.  Evaluated at [facility] hospital.  Records reviewed: CT head was negative for acute CVA or any intracranial bleed. Patient underwent MRI of the brain which was negative for acute infarct, negative for chronic ischemia or demyelinating disease. MRA brain was negative for any stenosis or thrombus in any intracranial vessels. Carotid Dopplers showed less than 39% ICA stenosis bilateral. 2-D echo showed EF of 60-65%, no regional wall motion abnormalities. Lipid panel showed cholesterol 171, LDL 104, patient was started on statins.  Protein C activity was elevated but otherwise hypercoagulable panel was negative. TSH was elevated at 6s.  Due for recheck.  Seen 3 wks later at [facility] with another episode of vision changes - described as blurry vision and zig zag lines in both eyes.    No appt with neurology scheduled. No OBGYN.  Due for pap smear - would like to set up with OBGYN. Would like referrals for both today.  10lb weight gain in the last year.   H/o borderline hypothyroidism.  Preventative: Last well woman 09/2012 WNL Last pap 09/2011 Tetanus - unsure flu - declines LMP - unsure - prior on birth control did not have periods, stopped birth control 08/05/2013.  Caffeine: 1 cup soda, tea Lives with husband and 1 daughter (2006), fish Occ: [employer]: HS Activity: softball Diet: some water, fruits/vegetables daily  Medications and allergies reviewed and updated in chart.  Past histories reviewed and updated if relevant as below. Patient Active Problem List   Diagnosis Date Noted  . TIA (transient ischemic attack) 07/16/2013  . Numbness and tingling of right face 07/16/2013  . Visual  disturbance 07/16/2013   Past Medical History  Diagnosis Date  . HLD (hyperlipidemia)     borderline  . Ocular migraine 07/2013  . Hx-TIA (transient ischemic attack) 07/08/13   Past Surgical History  Procedure Laterality Date  . No past surgeries     History  Substance Use Topics  . Smoking status: Never Smoker   . Smokeless tobacco: Never Used  . Alcohol Use: 0.6 oz/week    1 Glasses of wine per week     Comment: Rare/social   Family History  Problem Relation Age of Onset  . Depression Mother   . Depression Sister   . Heart disease Maternal Grandmother     open heart surgery  . Cancer Maternal Grandfather     prostate s/p seeds  . Diabetes Paternal Grandmother   . Diabetes Maternal Grandmother   . Hypertension Father   . Hyperlipidemia Neg Hx   . Stroke Neg Hx    Allergies  Allergen Reactions  . Amoxicillin Hives and Rash    Muscle aches  . Penicillins Hives and Rash    Muscle aches   Current Outpatient Prescriptions on File Prior to Visit  Medication Sig Dispense Refill  . acetaminophen (TYLENOL) 325 MG tablet Take 325 mg by mouth every 6 (six) hours as needed for pain.      Marland Kitchen aspirin 81 MG chewable tablet Chew 1 tablet (81 mg total) by mouth daily.  30 tablet  3  . simvastatin (ZOCOR) 20 MG tablet Take 1 tablet (20 mg total) by  mouth at bedtime.  30 tablet  3   No current facility-administered medications on file prior to visit.     Review of Systems  Constitutional: Negative for fever, chills, activity change, appetite change, fatigue and unexpected weight change.  HENT: Negative for hearing loss and neck pain.   Eyes: Positive for visual disturbance (see hpi).  Respiratory: Negative for cough, chest tightness, shortness of breath and wheezing.   Cardiovascular: Negative for chest pain, palpitations and leg swelling.  Gastrointestinal: Negative for nausea, vomiting, abdominal pain, diarrhea, constipation, blood in stool and abdominal distention.   Genitourinary: Negative for hematuria and difficulty urinating.  Musculoskeletal: Negative for myalgias and arthralgias.  Skin: Negative for rash.  Neurological: Negative for dizziness, seizures, syncope and headaches.  Hematological: Negative for adenopathy. Does not bruise/bleed easily.  Psychiatric/Behavioral: Negative for dysphoric mood. The patient is not nervous/anxious.        Objective:   Physical Exam  Nursing note and vitals reviewed. Constitutional: She is oriented to person, place, and time. She appears well-developed and well-nourished. No distress.  HENT:  Head: Normocephalic and atraumatic.  Right Ear: Hearing, tympanic membrane, external ear and ear canal normal.  Left Ear: Hearing, tympanic membrane, external ear and ear canal normal.  Nose: Nose normal.  Mouth/Throat: Oropharynx is clear and moist. No oropharyngeal exudate.  Eyes: Conjunctivae and EOM are normal. Pupils are equal, round, and reactive to light. No scleral icterus.  Neck: Normal range of motion. Neck supple. Carotid bruit is not present. No thyromegaly present.  Cardiovascular: Normal rate, regular rhythm, normal heart sounds and intact distal pulses.   No murmur heard. Pulses:      Radial pulses are 2+ on the right side, and 2+ on the left side.  Pulmonary/Chest: Effort normal and breath sounds normal. No respiratory distress. She has no wheezes. She has no rales.  Abdominal: Soft. Bowel sounds are normal. She exhibits no distension and no mass. There is no tenderness. There is no rebound and no guarding.  Musculoskeletal: Normal range of motion. She exhibits no edema.  Lymphadenopathy:    She has no cervical adenopathy.  Neurological: She is alert and oriented to person, place, and time.  CN grossly intact, station and gait intact  Skin: Skin is warm and dry. No rash noted.  Psychiatric: She has a normal mood and affect. Her behavior is normal. Judgment and thought content normal.        Assessment & Plan:

## 2013-09-24 ENCOUNTER — Ambulatory Visit (INDEPENDENT_AMBULATORY_CARE_PROVIDER_SITE_OTHER): Payer: BC Managed Care – PPO | Admitting: Family Medicine

## 2013-09-24 ENCOUNTER — Encounter: Payer: Self-pay | Admitting: Family Medicine

## 2013-09-24 VITALS — BP 113/77 | HR 75 | Ht 66.0 in | Wt 121.0 lb

## 2013-09-24 DIAGNOSIS — Z124 Encounter for screening for malignant neoplasm of cervix: Secondary | ICD-10-CM

## 2013-09-24 DIAGNOSIS — Z01419 Encounter for gynecological examination (general) (routine) without abnormal findings: Secondary | ICD-10-CM

## 2013-09-24 DIAGNOSIS — E038 Other specified hypothyroidism: Secondary | ICD-10-CM

## 2013-09-24 DIAGNOSIS — G459 Transient cerebral ischemic attack, unspecified: Secondary | ICD-10-CM

## 2013-09-24 DIAGNOSIS — E039 Hypothyroidism, unspecified: Secondary | ICD-10-CM

## 2013-09-24 DIAGNOSIS — Z1151 Encounter for screening for human papillomavirus (HPV): Secondary | ICD-10-CM

## 2013-09-24 MED ORDER — PRENATAL 19 PO CHEW: 1.0000 | CHEWABLE_TABLET | Freq: Every day | ORAL | Status: DC

## 2013-09-24 NOTE — Patient Instructions (Signed)
Preparing for Pregnancy Preparing for pregnancy (preconceptual care) by getting counseling and information from your caregiver before getting pregnant is a good idea. It will help you and your baby have a better chance to have a healthy, safe pregnancy and delivery of your baby. Make an appointment with your caregiver to talk about your health, medical, and family history and how to prepare yourself before getting pregnant. Your caregiver will do a complete physical exam and a Pap test. They will want to know:  About you, your spouse or partner, and your family's medical and genetic history.  If you are eating a balanced diet and drinking enough fluids.  What vitamins and mineral supplements you are taking. This includes taking folic acid before getting pregnant to help prevent birth defects.  What medications you are taking including prescription, over-the-counter and herbal medications.  If there is any substance abuse like alcohol, smoking, and illegal drugs.  If there is any mental or physical domestic violence.  If there is any risk of sexually transmitted disease between you and your partner.  What immunizations and vaccinations you have had and what you may need before getting pregnant.  If you should get tested for HIV infection.  If there is any exposure to chemical or toxic substances at home or work.  If there are medical problems you have that need to be treated and kept under control before getting pregnant such as diabetes, high blood pressure or others.  If there were any past surgeries, pregnancies and problems with them.  What your current weight is and to set a goal as to how much weight you should gain while pregnant. Also, they will check if you should lose or gain weight before getting pregnant.  What is your exercise routine and what it is safe when you are pregnant.  If there are any physical disabilities that need to be addressed.  About spacing your  pregnancies when there are other children.  If there is a financial problem that may affect you having a child. After talking about the above points with your caregiver, your caregiver will give you advice on how to help treat and work with you on solving any issues, if necessary, before getting pregnant. The goal is to have a healthy and safe pregnancy for you and your baby. You should keep an accurate record of your menstrual periods because it will help in determining your due date. Immunizations that you should have before getting pregnant:   Regular measles, [place] measles (rubella) and mumps.  Tetanus and diphtheria.  Chickenpox, if not immune.  Herpes zoster (Varicella) if not immune.  Human papilloma virus vaccine (HPV) between the age of 97 and 81 years old.  Hepatitis A vaccine.  Hepatitis B vaccine.  Influenza vaccine.  Pneumococcal vaccine (pneumonia). You should avoid getting pregnant for one month after getting vaccinated with a live virus vaccine such as [place] measles (rubella) vaccine. Other immunizations may be necessary depending on where you live, such as malaria. Ask your caregiver if any other immunizations are needed for you. HOME CARE INSTRUCTIONS   Follow the advice of your caregiver.  Before getting pregnant:  Begin taking vitamins, supplements, and 0.4 milligrams folic acid daily.  Get your immunizations up-to-date.  Get help from a nutrition counselor if you do not understand what a balanced diet is, need help with a special medical diet or if you need help to lose or gain weight.  Begin exercising.  Stop smoking, taking illegal drugs,  and drinking alcoholic beverages.  Get counseling if there is and type of domestic violence.  Get checked for sexually transmitted diseases including HIV.  Get any medical problems under control (diabetes, high blood pressure, convulsions, asthma or others).  Resolve any financial concerns.  Be sure you and  your spouse or partner are ready to have a baby.  Keep an accurate record of your menstrual periods. Document Released: 11/16/2008 Document Revised: 02/26/2012 Document Reviewed: 11/16/2008 [facility] Patient Information 2014 [place], [place]. Preventive Care for Adults, Female A healthy lifestyle and preventive care can promote health and wellness. Preventive health guidelines for women include the following key practices.  A routine yearly physical is a good way to check with your caregiver about your health and preventive screening. It is a chance to share any concerns and updates on your health, and to receive a thorough exam.  Visit your dentist for a routine exam and preventive care every 6 months. Brush your teeth twice a day and floss once a day. Good oral hygiene prevents tooth decay and gum disease.  The frequency of eye exams is based on your age, health, family medical history, use of contact lenses, and other factors. Follow your caregiver's recommendations for frequency of eye exams.  Eat a healthy diet. Foods like vegetables, fruits, whole grains, low-fat dairy products, and lean protein foods contain the nutrients you need without too many calories. Decrease your intake of foods high in solid fats, added sugars, and salt. Eat the right amount of calories for you.Get information about a proper diet from your caregiver, if necessary.  Regular physical exercise is one of the most important things you can do for your health. Most adults should get at least 150 minutes of moderate-intensity exercise (any activity that increases your heart rate and causes you to sweat) each week. In addition, most adults need muscle-strengthening exercises on 2 or more days a week.  Maintain a healthy weight. The body mass index (BMI) is a screening tool to identify possible weight problems. It provides an estimate of body fat based on height and weight. Your caregiver can help determine your BMI, and can  help you achieve or maintain a healthy weight.For adults 20 years and older:  A BMI below 18.5 is considered underweight.  A BMI of 18.5 to 24.9 is normal.  A BMI of 25 to 29.9 is considered overweight.  A BMI of 30 and above is considered obese.  Maintain normal blood lipids and cholesterol levels by exercising and minimizing your intake of saturated fat. Eat a balanced diet with plenty of fruit and vegetables. Blood tests for lipids and cholesterol should begin at age 59 and be repeated every 5 years. If your lipid or cholesterol levels are high, you are over 50, or you are at high risk for heart disease, you may need your cholesterol levels checked more frequently.Ongoing high lipid and cholesterol levels should be treated with medicines if diet and exercise are not effective.  If you smoke, find out from your caregiver how to quit. If you do not use tobacco, do not start.  If you are pregnant, do not drink alcohol. If you are breastfeeding, be very cautious about drinking alcohol. If you are not pregnant and choose to drink alcohol, do not exceed 1 drink per day. One drink is considered to be 12 ounces (355 mL) of beer, 5 ounces (148 mL) of wine, or 1.5 ounces (44 mL) of liquor.  Avoid use of street drugs. Do not  share needles with anyone. Ask for help if you need support or instructions about stopping the use of drugs.  High blood pressure causes heart disease and increases the risk of stroke. Your blood pressure should be checked at least every 1 to 2 years. Ongoing high blood pressure should be treated with medicines if weight loss and exercise are not effective.  If you are 7 to 32 years old, ask your caregiver if you should take aspirin to prevent strokes.  Diabetes screening involves taking a blood sample to check your fasting blood sugar level. This should be done once every 3 years, after age 12, if you are within normal weight and without risk factors for diabetes. Testing  should be considered at a younger age or be carried out more frequently if you are overweight and have at least 1 risk factor for diabetes.  Breast cancer screening is essential preventive care for women. You should practice "breast self-awareness." This means understanding the normal appearance and feel of your breasts and may include breast self-examination. Any changes detected, no matter how small, should be reported to a caregiver. Women in their 40s and 30s should have a clinical breast exam (CBE) by a caregiver as part of a regular health exam every 1 to 3 years. After age 49, women should have a CBE every year. Starting at age 77, women should consider having a mammography (breast X-ray test) every year. Women who have a family history of breast cancer should talk to their caregiver about genetic screening. Women at a high risk of breast cancer should talk to their caregivers about having magnetic resonance imaging (MRI) and a mammography every year.  The Pap test is a screening test for cervical cancer. A Pap test can show cell changes on the cervix that might become cervical cancer if left untreated. A Pap test is a procedure in which cells are obtained and examined from the lower end of the uterus (cervix).  Women should have a Pap test starting at age 59.  Between ages 34 and 17, Pap tests should be repeated every 2 years.  Beginning at age 35, you should have a Pap test every 3 years as long as the past 3 Pap tests have been normal.  Some women have medical problems that increase the chance of getting cervical cancer. Talk to your caregiver about these problems. It is especially important to talk to your caregiver if a new problem develops soon after your last Pap test. In these cases, your caregiver may recommend more frequent screening and Pap tests.  The above recommendations are the same for women who have or have not gotten the vaccine for human papillomavirus (HPV).  If you had a  hysterectomy for a problem that was not cancer or a condition that could lead to cancer, then you no longer need Pap tests. Even if you no longer need a Pap test, a regular exam is a good idea to make sure no other problems are starting.  If you are between ages 44 and 43, and you have had normal Pap tests going back 10 years, you no longer need Pap tests. Even if you no longer need a Pap test, a regular exam is a good idea to make sure no other problems are starting.  If you have had past treatment for cervical cancer or a condition that could lead to cancer, you need Pap tests and screening for cancer for at least 20 years after your treatment.  If  Pap tests have been discontinued, risk factors (such as a new sexual partner) need to be reassessed to determine if screening should be resumed.  The HPV test is an additional test that may be used for cervical cancer screening. The HPV test looks for the virus that can cause the cell changes on the cervix. The cells collected during the Pap test can be tested for HPV. The HPV test could be used to screen women aged 13 years and older, and should be used in women of any age who have unclear Pap test results. After the age of 55, women should have HPV testing at the same frequency as a Pap test.  Colorectal cancer can be detected and often prevented. Most routine colorectal cancer screening begins at the age of 67 and continues through age 78. However, your caregiver may recommend screening at an earlier age if you have risk factors for colon cancer. On a yearly basis, your caregiver may provide home test kits to check for hidden blood in the stool. Use of a small camera at the end of a tube, to directly examine the colon (sigmoidoscopy or colonoscopy), can detect the earliest forms of colorectal cancer. Talk to your caregiver about this at age 76, when routine screening begins. Direct examination of the colon should be repeated every 5 to 10 years through age  84, unless early forms of pre-cancerous polyps or small growths are found.  Hepatitis C blood testing is recommended for all people born from [birth year] through 1965 and any individual with known risks for hepatitis C.  Practice safe sex. Use condoms and avoid high-risk sexual practices to reduce the spread of sexually transmitted infections (STIs). STIs include gonorrhea, chlamydia, syphilis, trichomonas, herpes, HPV, and human immunodeficiency virus (HIV). Herpes, HIV, and HPV are viral illnesses that have no cure. They can result in disability, cancer, and death. Sexually active women aged 86 and younger should be checked for chlamydia. Older women with new or multiple partners should also be tested for chlamydia. Testing for other STIs is recommended if you are sexually active and at increased risk.  Osteoporosis is a disease in which the bones lose minerals and strength with aging. This can result in serious bone fractures. The risk of osteoporosis can be identified using a bone density scan. Women ages 59 and over and women at risk for fractures or osteoporosis should discuss screening with their caregivers. Ask your caregiver whether you should take a calcium supplement or vitamin D to reduce the rate of osteoporosis.  Menopause can be associated with physical symptoms and risks. Hormone replacement therapy is available to decrease symptoms and risks. You should talk to your caregiver about whether hormone replacement therapy is right for you.  Use sunscreen with sun protection factor (SPF) of 30 or more. Apply sunscreen liberally and repeatedly throughout the day. You should seek shade when your shadow is shorter than you. Protect yourself by wearing long sleeves, pants, a wide-brimmed hat, and sunglasses year round, whenever you are outdoors.  Once a month, do a whole body skin exam, using a mirror to look at the skin on your back. Notify your caregiver of new moles, moles that have irregular borders,  moles that are larger than a pencil eraser, or moles that have changed in shape or color.  Stay current with required immunizations.  Influenza. You need a dose every fall (or winter). The composition of the flu vaccine changes each year, so being vaccinated once is not enough.  Pneumococcal polysaccharide. You need 1 to 2 doses if you smoke cigarettes or if you have certain chronic medical conditions. You need 1 dose at age 5 (or older) if you have never been vaccinated.  Tetanus, diphtheria, pertussis (Tdap, Td). Get 1 dose of Tdap vaccine if you are younger than age 47, are over 50 and have contact with an infant, are a Research scientist (physical sciences), are pregnant, or simply want to be protected from whooping cough. After that, you need a Td booster dose every 10 years. Consult your caregiver if you have not had at least 3 tetanus and diphtheria-containing shots sometime in your life or have a deep or dirty wound.  HPV. You need this vaccine if you are a woman age 31 or younger. The vaccine is given in 3 doses over 6 months.  Measles, mumps, rubella (MMR). You need at least 1 dose of MMR if you were born in 1957 or later. You may also need a second dose.  Meningococcal. If you are age 45 to 98 and a first-year college student living in a residence hall, or have one of several medical conditions, you need to get vaccinated against meningococcal disease. You may also need additional booster doses.  Zoster (shingles). If you are age 24 or older, you should get this vaccine.  Varicella (chickenpox). If you have never had chickenpox or you were vaccinated but received only 1 dose, talk to your caregiver to find out if you need this vaccine.  Hepatitis A. You need this vaccine if you have a specific risk factor for hepatitis A virus infection or you simply wish to be protected from this disease. The vaccine is usually given as 2 doses, 6 to 18 months apart.  Hepatitis B. You need this vaccine if you have a  specific risk factor for hepatitis B virus infection or you simply wish to be protected from this disease. The vaccine is given in 3 doses, usually over 6 months. Preventive Services / Frequency Ages 74 to 82  Blood pressure check.** / Every 1 to 2 years.  Lipid and cholesterol check.** / Every 5 years beginning at age 58.  Clinical breast exam.** / Every 3 years for women in their 48s and 30s.  Pap test.** / Every 2 years from ages 1 through 25. Every 3 years starting at age 46 through age 87 or 85 with a history of 3 consecutive normal Pap tests.  HPV screening.** / Every 3 years from ages 89 through ages 29 to 51 with a history of 3 consecutive normal Pap tests.  Hepatitis C blood test.** / For any individual with known risks for hepatitis C.  Skin self-exam. / Monthly.  Influenza immunization.** / Every year.  Pneumococcal polysaccharide immunization.** / 1 to 2 doses if you smoke cigarettes or if you have certain chronic medical conditions.  Tetanus, diphtheria, pertussis (Tdap, Td) immunization. / A one-time dose of Tdap vaccine. After that, you need a Td booster dose every 10 years.  HPV immunization. / 3 doses over 6 months, if you are 13 and younger.  Measles, mumps, rubella (MMR) immunization. / You need at least 1 dose of MMR if you were born in 1957 or later. You may also need a second dose.  Meningococcal immunization. / 1 dose if you are age 99 to 71 and a first-year college student living in a residence hall, or have one of several medical conditions, you need to get vaccinated against meningococcal disease. You may also need additional  booster doses.  Varicella immunization.** / Consult your caregiver.  Hepatitis A immunization.** / Consult your caregiver. 2 doses, 6 to 18 months apart.  Hepatitis B immunization.** / Consult your caregiver. 3 doses usually over 6 months. Ages 68 to 52  Blood pressure check.** / Every 1 to 2 years.  Lipid and cholesterol  check.** / Every 5 years beginning at age 30.  Clinical breast exam.** / Every year after age 35.  Mammogram.** / Every year beginning at age 8 and continuing for as long as you are in good health. Consult with your caregiver.  Pap test.** / Every 3 years starting at age 2 through age 83 or 72 with a history of 3 consecutive normal Pap tests.  HPV screening.** / Every 3 years from ages 48 through ages 31 to 18 with a history of 3 consecutive normal Pap tests.  Fecal occult blood test (FOBT) of stool. / Every year beginning at age 62 and continuing until age 32. You may not need to do this test if you get a colonoscopy every 10 years.  Flexible sigmoidoscopy or colonoscopy.** / Every 5 years for a flexible sigmoidoscopy or every 10 years for a colonoscopy beginning at age 76 and continuing until age 63.  Hepatitis C blood test.** / For all people born from [birth year] through 1965 and any individual with known risks for hepatitis C.  Skin self-exam. / Monthly.  Influenza immunization.** / Every year.  Pneumococcal polysaccharide immunization.** / 1 to 2 doses if you smoke cigarettes or if you have certain chronic medical conditions.  Tetanus, diphtheria, pertussis (Tdap, Td) immunization.** / A one-time dose of Tdap vaccine. After that, you need a Td booster dose every 10 years.  Measles, mumps, rubella (MMR) immunization. / You need at least 1 dose of MMR if you were born in 1957 or later. You may also need a second dose.  Varicella immunization.** / Consult your caregiver.  Meningococcal immunization.** / Consult your caregiver.  Hepatitis A immunization.** / Consult your caregiver. 2 doses, 6 to 18 months apart.  Hepatitis B immunization.** / Consult your caregiver. 3 doses, usually over 6 months. Ages 67 and over  Blood pressure check.** / Every 1 to 2 years.  Lipid and cholesterol check.** / Every 5 years beginning at age 31.  Clinical breast exam.** / Every year after age  13.  Mammogram.** / Every year beginning at age 55 and continuing for as long as you are in good health. Consult with your caregiver.  Pap test.** / Every 3 years starting at age 34 through age 61 or 21 with a 3 consecutive normal Pap tests. Testing can be stopped between 65 and 70 with 3 consecutive normal Pap tests and no abnormal Pap or HPV tests in the past 10 years.  HPV screening.** / Every 3 years from ages 36 through ages 40 or 38 with a history of 3 consecutive normal Pap tests. Testing can be stopped between 65 and 70 with 3 consecutive normal Pap tests and no abnormal Pap or HPV tests in the past 10 years.  Fecal occult blood test (FOBT) of stool. / Every year beginning at age 40 and continuing until age 74. You may not need to do this test if you get a colonoscopy every 10 years.  Flexible sigmoidoscopy or colonoscopy.** / Every 5 years for a flexible sigmoidoscopy or every 10 years for a colonoscopy beginning at age 72 and continuing until age 14.  Hepatitis C blood test.** / For  all people born from [birth year] through 1965 and any individual with known risks for hepatitis C.  Osteoporosis screening.** / A one-time screening for women ages 69 and over and women at risk for fractures or osteoporosis.  Skin self-exam. / Monthly.  Influenza immunization.** / Every year.  Pneumococcal polysaccharide immunization.** / 1 dose at age 30 (or older) if you have never been vaccinated.  Tetanus, diphtheria, pertussis (Tdap, Td) immunization. / A one-time dose of Tdap vaccine if you are over 65 and have contact with an infant, are a Research scientist (physical sciences), or simply want to be protected from whooping cough. After that, you need a Td booster dose every 10 years.  Varicella immunization.** / Consult your caregiver.  Meningococcal immunization.** / Consult your caregiver.  Hepatitis A immunization.** / Consult your caregiver. 2 doses, 6 to 18 months apart.  Hepatitis B immunization.** / Check with  your caregiver. 3 doses, usually over 6 months. ** Family history and personal history of risk and conditions may change your caregiver's recommendations. Document Released: 01/30/2002 Document Revised: 02/26/2012 Document Reviewed: 05/01/2011 [facility] Patient Information 2014 [place], [place].

## 2013-09-24 NOTE — Progress Notes (Signed)
  Subjective:     Kelsey Anthony is a 32 y.o. female and is here for a comprehensive physical exam. The patient reports problems - recent loss of vision in one eye and presumed TIA.  Then with ocular migraine.  Has had negative thrombophilia w/u.  Negative MRI.  Pt. will see neuro in 1 wk.  Taken off OC's- which I agree with.  Interested in becoming pregnant.  Carries dx of elevated cholesterol, on Zocor, recent labs WNL.  Also with ? subclinical hypothyroid, but normal TFT's and TSH recently.Marland Kitchen  History   Social History  . Marital Status: Married    Spouse Name: N/A    Number of Children: N/A  . Years of Education: N/A   Occupational History  . Not on file.   Social History Main Topics  . Smoking status: Never Smoker   . Smokeless tobacco: Never Used  . Alcohol Use: 0.6 oz/week    1 Glasses of wine per week     Comment: Rare/social  . Drug Use: No  . Sexual Activity: Yes    Partners: Male    Birth Control/ Protection: Condom   Other Topics Concern  . Not on file   Social History Narrative   Caffeine: 1 cup soda, tea   Lives with husband and 1 daughter (2006), fish   Occ: Banker   Edu: HS   Activity: softball   Diet: some water, fruits/vegetables daily   Health Maintenance  Topic Date Due  . Pap Smear  02/11/1999  . Tetanus/tdap  02/12/2000  . Influenza Vaccine  07/18/2013    The following portions of the patient's history were reviewed and updated as appropriate: allergies, current medications, past family history, past medical history, past social history, past surgical history and problem list.  Review of Systems Pertinent items are noted in HPI.   Objective:    BP 113/77  Pulse 75  Ht 5\' 6"  (1.676 m)  Wt 121 lb (54.885 kg)  BMI 19.54 kg/m2  LMP 09/17/2013 General appearance: alert, cooperative and appears stated age Head: Normocephalic, without obvious abnormality, atraumatic Neck: no adenopathy, supple, symmetrical, trachea midline and thyroid not  enlarged, symmetric, no tenderness/mass/nodules Lungs: clear to auscultation bilaterally Breasts: normal appearance, no masses or tenderness Heart: regular rate and rhythm, S1, S2 normal, no murmur, click, rub or gallop Abdomen: soft, non-tender; bowel sounds normal; no masses,  no organomegaly Pelvic: cervix normal in appearance, external genitalia normal, no adnexal masses or tenderness, no cervical motion tenderness, uterus normal size, shape, and consistency, vagina normal without discharge and uterus is retroverted Extremities: extremities normal, atraumatic, no cyanosis or edema Pulses: 2+ and symmetric Skin: Skin color, texture, turgor normal. No rashes or lesions Lymph nodes: Cervical, supraclavicular, and axillary nodes normal. Neurologic: Grossly normal    Assessment:    Healthy female exam. Probable ocular migraines.  Desires fertility.     Plan:  Pap smear today Begin PNV's Stop Zocor if trying to achieve pregnancy F/u with Neurology and follow their recommendations.   If only, ocular migraines, there is not a contraindication for pregnancy.   See After Visit Summary for Counseling Recommendations

## 2013-09-24 NOTE — Assessment & Plan Note (Signed)
Ocular migraine

## 2013-09-24 NOTE — Assessment & Plan Note (Signed)
Last TFT's 3 wks ago were WNL

## 2013-10-01 ENCOUNTER — Encounter: Payer: Self-pay | Admitting: Neurology

## 2013-10-01 ENCOUNTER — Ambulatory Visit (INDEPENDENT_AMBULATORY_CARE_PROVIDER_SITE_OTHER): Payer: BLUE CROSS/BLUE SHIELD | Admitting: Neurology

## 2013-10-01 VITALS — BP 95/66 | HR 77 | Resp 16 | Ht 66.0 in | Wt 120.0 lb

## 2013-10-01 DIAGNOSIS — G459 Transient cerebral ischemic attack, unspecified: Secondary | ICD-10-CM

## 2013-10-01 NOTE — Progress Notes (Signed)
[facility]  Provider:  Melvyn Novas, M D  Referring Provider: Eustaquio Boyden, MD Primary Care Physician:  Eustaquio Boyden, MD  Chief Complaint  Patient presents with  . TIA/Internal Referal NP    vision has been checked Rt eye was 20/20 Lt eye was 20/20 un corected    HPI:  Kelsey Anthony is a 32 y.o. female  Is seen here as a referral/ revisit  from Dr. Sharen Hones for a possibe TIA with sudden loss of vision in the right eye.   She underwent extensive testing at the emergency room  on the day of occurrence. The patient reported right sided loss of vision while driving, panicked -than noted  right facial numbness and was stuttering , she spoke to her husband on the phone - this stuttering and inability to express herself were called " confusion "when she was evaluated at [facility].  It seems like a transient aphasia. The duration of vision  symptoms was less than one minute. The speech changes lasted about 45 minutes.  In the ED an extensive work up was undertaken.  MRI WAS not obtained at the same night and therefore the patient was admitted for observation and the MRI performed on 07-16-13. Dr. Alvera Singh was the admitting physician. The MRI and MRA were entirely normal and reviewed today on the computer. Protein C activity was elevated but otherwise hypercoagulable panel was negative;  lipid panel showed a total cholesterol of 171;  LDL of 104 . The patient was started on statins after a hospital visit. TSH was slightly elevated and she will be checked as was her primary care physician on a regular basis.  Interesting is that the patient just 3 weeks after her presumed TIA presented with another episode of vision changes described as is blurry vision and zigzag lines in both eyes' vision - was  seen by Dr. Lorenz Coaster in the urgent care.  This was interpreted as a neurologic migraine with aura. Interestingly, she had not experienced any headaches following the  aura.  The patient was asked to discontinue hormonal birth control, and take a baby aspirin chewable once a day.   She is also asked to take prenatal vitamins by her Gynecologist in preparation for a her desire to have a second child. Her daughter was born in 2006.   Out of a complete 14 system review, the patient complains of only the following symptoms, and all other reviewed systems are negative.  normal sleep, vision, no headaches.   History   Social History  . Marital Status: Married    Spouse Name: Judie Grieve    Number of Children: 1  . Years of Education: 12   Occupational History  .      [employer]   Social History Main Topics  . Smoking status: Never Smoker   . Smokeless tobacco: Never Used  . Alcohol Use: 0.6 oz/week    1 Glasses of wine per week     Comment: Rare/social  . Drug Use: No  . Sexual Activity: Yes    Partners: Male    Birth Control/ Protection: Condom   Other Topics Concern  . Not on file   Social History Narrative   Caffeine: 1 cup soda, tea   Lives with husband and 1 daughter (2006), fish   Occ: Banker   Edu: HS   Activity: softball   Diet: some water, fruits/vegetables daily    Family History  Problem Relation Age of Onset  . Depression  Mother   . Depression Sister   . Heart disease Maternal Grandmother     open heart surgery  . Cancer Maternal Grandfather     prostate s/p seeds  . Diabetes Paternal Grandmother   . Diabetes Maternal Grandmother   . Hypertension Father   . Hyperlipidemia Neg Hx   . Stroke Neg Hx     Past Medical History  Diagnosis Date  . HLD (hyperlipidemia)     borderline  . Ocular migraine 07/2013  . Hx-TIA (transient ischemic attack) 07/08/13    Past Surgical History  Procedure Laterality Date  . No past surgeries      Current Outpatient Prescriptions  Medication Sig Dispense Refill  . aspirin 81 MG chewable tablet Chew 1 tablet (81 mg total) by mouth daily.  30 tablet  3  . simvastatin (ZOCOR) 20 MG tablet  Take 1 tablet (20 mg total) by mouth at bedtime.  30 tablet  3  . acetaminophen (TYLENOL) 325 MG tablet Take 325 mg by mouth every 6 (six) hours as needed for pain.      . Prenatal Vit-Fe Fumarate-FA (PRENATAL 19) tablet Chew 1 tablet by mouth daily.  90 tablet  3   No current facility-administered medications for this visit.    Allergies as of 10/01/2013 - Review Complete 10/01/2013  Allergen Reaction Noted  . Amoxicillin Hives and Rash 10/10/2012  . Penicillins Hives and Rash 10/10/2012    Vitals: BP 95/66  Pulse 77  Resp 16  Ht 5\' 6"  (1.676 m)  Wt 120 lb (54.432 kg)  BMI 19.38 kg/m2  LMP 09/17/2013 Last Weight:  Wt Readings from Last 1 Encounters:  10/01/13 120 lb (54.432 kg)   Last Height:   Ht Readings from Last 1 Encounters:  10/01/13 5\' 6"  (1.676 m)    Physical exam:  General: The patient is awake, alert and appears not in acute distress. The patient is well groomed. Head: Normocephalic, atraumatic. Neck is supple. Mallampati 2 , neck circumference:13.5  Cardiovascular:  Regular rate and rhythm , without  murmurs or carotid bruit, and without distended neck veins. Respiratory: Lungs are clear to auscultation. Skin:  Without evidence of edema, or rash Trunk: BMI is normal .  Neurologic exam : The patient is awake and alert, oriented to place and time.  Memory subjective   described as intact. There is a normal attention span & concentration ability.  Speech is fluent without dysarthria, dysphonia or aphasia. Mood and affect are appropriate.  Cranial nerves: Pupils are equal and briskly reactive to light. Funduscopic exam without   evidence of pallor or edema. Extraocular movements  in vertical and horizontal planes intact and without nystagmus. Visual fields by finger perimetry are intact. Hearing to finger rub intact.  Facial sensation intact to fine touch. Facial motor strength is symmetric and tongue and uvula move midline.  Motor exam:   Normal tone and normal  muscle bulk and symmetric normal strength in all extremities.  Sensory:  Fine touch, pinprick and vibration were tested in all extremities. Proprioception is tested in the upper extremities only. This was normal.  Coordination: Rapid alternating movements in the fingers/hands is tested and normal. Finger-to-nose maneuver tested and normal without evidence of ataxia, dysmetria or tremor.  Gait and station: Patient walks without assistive device and is able and assisted stool climb up to the exam table. Strength within normal limits. Stance is stable and normal. Tandem gait is unfragmented. Romberg testing is normal.  Deep tendon reflexes: in the  upper and lower extremities are symmetric and intact. Babinski maneuver response is  downgoing.   Assessment:  After physical and neurologic examination, review of laboratory studies, imaging, neurophysiology testing and pre-existing records, assessment is that of a possible TIA-  I found CT and MRI to be negative ,  Carotid doppler and Echo  as well .  Labs reviewed.  I see little evidence for an elevated CVA risk in this patient without family history.    Plan:  Treatment plan and additional workup : Continue  81 mg ASA. Continue  Zocor;  Hydrate , physical activity, reduce caffeine. Get 7 hours of sleep. Barrier contraception , not hormonal contraception.

## 2013-10-01 NOTE — Patient Instructions (Signed)
Stroke Prevention  Some health problems and behaviors may make it more likely for you to have a stroke. Below are ways to lessen your risk of having a stroke.    Be active for at least 30 minutes on most or all days.   Do not smoke. Try not to be around others who smoke (secondhand smoke).   Do not drink too much alcohol.   Eat healthy foods, such as fruits and vegetables. If you were put on a specific diet, follow the diet as told.   Keep your cholesterol levels under control through diet and medicines. Look for foods that are low in saturated fat, trans fat, cholesterol, and are high in fiber.   If you have diabetes, follow all diet plans and take your medicine as told.   If you have high blood pressure (hypertension), follow all diet plans and take your medicine as told.   Keep a healthy weight. Eat foods that are low in calories, salt, saturated fat, trans fat, and cholesterol.   Do not take drugs.   Avoid birth control pills, if this applies. Talk to your doctor about the risks of taking birth control pills.   Talk to your doctor if you have sleep problems (sleep apnea).   Take all medicine as told by your doctor.   You may be told to take aspirin or blood thinner medicine. Take this medicine as told.   Understand your medicine instructions.  GET HELP RIGHT AWAY IF:   You lose feeling (numbness) or have weakness in the face, arm, or leg.   You lose feeling or have weakness on one side of the body.   You feel suddenly confused.   You have trouble talking or understanding what people are saying.   You have sudden trouble seeing in one or both eyes.   You have sudden trouble walking.   You are dizzy.   You lose your balance or your movements are clumsy (uncoordinated).   You suddenly have a very bad headache, and you do not know the cause.   You have new chest pain.   Your heart feels like it is fluttering or skipping a beat (irregular heartbeat).  Do not wait to see if the symptoms above  go away. Get help right away. Call your local emergency services (911 in [place] Do not drive yourself to the hospital.  Document Released: 06/04/2012 Document Reviewed: 06/04/2012  [employer] Patient Information 2014 [employer].

## 2013-10-15 ENCOUNTER — Telehealth: Payer: Self-pay | Admitting: *Deleted

## 2013-10-15 MED ORDER — PROVIDA OB 20-20-1.25 MG PO CAPS: 1.0000 | ORAL_CAPSULE | Freq: Every day | ORAL | Status: DC

## 2013-10-15 NOTE — Telephone Encounter (Signed)
Patient needs different prenatal vitamin called into pharmacy as the one we called in is no longer made.

## 2013-12-18 DIAGNOSIS — O350XX0 Maternal care for (suspected) central nervous system malformation in fetus, not applicable or unspecified: Secondary | ICD-10-CM

## 2013-12-18 DIAGNOSIS — O3502X0 Maternal care for (suspected) central nervous system malformation or damage in fetus, anencephaly, not applicable or unspecified: Secondary | ICD-10-CM

## 2013-12-18 HISTORY — PX: OTHER SURGICAL HISTORY: SHX169

## 2013-12-18 HISTORY — DX: Maternal care for (suspected) central nervous system malformation in fetus, not applicable or unspecified: O35.0XX0

## 2013-12-18 HISTORY — DX: Maternal care for (suspected) central nervous system malformation or damage in fetus, anencephaly, not applicable or unspecified: O35.02X0

## 2013-12-18 NOTE — L&D Delivery Note (Addendum)
Delivery Note At 10:47 PM a 17 week anencephalic female fetus was delivered via Vaginal, Spontaneous Delivery .  Fetus was born en call in bloody fluid. Membranes ruptured. No heart beat was noted.  Cord appeared to avulse near the fetal insertion spontaneously without any traction.  Cytotec 800mcg was given and pitocin was started. A digital exam was used to determine the placenta was at the level of the internal os. It was freed manually and then a ring forcep was used to slowly bring the placenta out. Placenta was examined and appeared to be intact.  Bleeding was minimal.    Mom to gyn floor and baby to morgue.     Acea Yagi L Jedrick Hutcherson 03/27/2014, 11:08 PM

## 2013-12-18 NOTE — L&D Delivery Note (Signed)
Attestation of Attending Supervision of Obstetric Fellow: Evaluation and management procedures were performed by the Obstetric Fellow under my supervision and collaboration.  I have reviewed the Obstetric Fellow's note and chart, and I agree with the management and plan.  Tajanay Hurley, DO Attending Physician Faculty Practice, [facility]  

## 2014-01-22 ENCOUNTER — Encounter: Payer: Self-pay | Admitting: Obstetrics & Gynecology

## 2014-01-22 ENCOUNTER — Ambulatory Visit (INDEPENDENT_AMBULATORY_CARE_PROVIDER_SITE_OTHER): Payer: BC Managed Care – PPO | Admitting: Obstetrics & Gynecology

## 2014-01-22 VITALS — BP 113/66 | Wt 120.4 lb

## 2014-01-22 DIAGNOSIS — Z3480 Encounter for supervision of other normal pregnancy, unspecified trimester: Secondary | ICD-10-CM

## 2014-01-22 DIAGNOSIS — R05 Cough: Secondary | ICD-10-CM

## 2014-01-22 DIAGNOSIS — J309 Allergic rhinitis, unspecified: Secondary | ICD-10-CM

## 2014-01-22 DIAGNOSIS — E039 Hypothyroidism, unspecified: Secondary | ICD-10-CM

## 2014-01-22 DIAGNOSIS — R059 Cough, unspecified: Secondary | ICD-10-CM

## 2014-01-22 DIAGNOSIS — Z3490 Encounter for supervision of normal pregnancy, unspecified, unspecified trimester: Secondary | ICD-10-CM | POA: Insufficient documentation

## 2014-01-22 DIAGNOSIS — H539 Unspecified visual disturbance: Secondary | ICD-10-CM

## 2014-01-22 DIAGNOSIS — Z0000 Encounter for general adult medical examination without abnormal findings: Secondary | ICD-10-CM

## 2014-01-22 DIAGNOSIS — F3289 Other specified depressive episodes: Secondary | ICD-10-CM

## 2014-01-22 DIAGNOSIS — J209 Acute bronchitis, unspecified: Secondary | ICD-10-CM

## 2014-01-22 DIAGNOSIS — E038 Other specified hypothyroidism: Secondary | ICD-10-CM

## 2014-01-22 DIAGNOSIS — G43809 Other migraine, not intractable, without status migrainosus: Secondary | ICD-10-CM

## 2014-01-22 DIAGNOSIS — F4321 Adjustment disorder with depressed mood: Secondary | ICD-10-CM

## 2014-01-22 DIAGNOSIS — G459 Transient cerebral ischemic attack, unspecified: Secondary | ICD-10-CM

## 2014-01-22 DIAGNOSIS — F329 Major depressive disorder, single episode, unspecified: Secondary | ICD-10-CM

## 2014-01-22 LAB — OB RESULTS CONSOLE GC/CHLAMYDIA
CHLAMYDIA, DNA PROBE: NEGATIVE
Gonorrhea: NEGATIVE

## 2014-01-22 NOTE — Progress Notes (Signed)
    Subjective:    Kelsey Anthony is a G2P1001at 3917w2d, dated by CRL with clinic ultrasound, being seen today for her first obstetrical visit.  Her obstetrical history is significant for term SVD. Patient does intend to attempt to breast feed, bottle fed her last infant. Pregnancy history fully reviewed.  Patient reports no complaints.  Filed Vitals:   01/22/14 0839  BP: 113/66  Weight: 120 lb 6.4 oz (54.613 kg)    HISTORY: OB History  Gravida Para Term Preterm AB SAB TAB Ectopic Multiple Living  2 1 1       1     # Outcome Date GA Lbr Len/2nd Weight Sex Delivery Anes PTL Lv  2 CUR           1 TRM 08/23/05    F SVD   Y     Past Medical History  Diagnosis Date  . HLD (hyperlipidemia)     borderline  . Ocular migraine 07/2013  . Hx-TIA (transient ischemic attack) 07/08/13  . TIA (transient ischemic attack) 07/16/2013   Past Surgical History  Procedure Laterality Date  . No past surgeries     Family History  Problem Relation Age of Onset  . Depression Mother   . Depression Sister   . Heart disease Maternal Grandmother     open heart surgery  . Cancer Maternal Grandfather     prostate s/p seeds  . Diabetes Paternal Grandmother   . Diabetes Maternal Grandmother   . Hypertension Father   . Hyperlipidemia Neg Hx   . Stroke Neg Hx      Exam    Uterus:     Pelvic Exam:    Pelvic: Deferred  System: Breast:  normal appearance, no masses or tenderness   Skin: normal coloration and turgor, no rashes   Neurologic: normal, negative   Extremities: normal strength, tone, and muscle mass   HEENT PERRLA   Mouth/Teeth mucous membranes moist, pharynx normal without lesions and dental hygiene good   Neck supple and no masses   Cardiovascular: regular rate and rhythm   Respiratory:  appears well, vitals normal, no respiratory distress, acyanotic, normal RR, chest clear, no wheezing, crepitations, rhonchi, normal symmetric air entry   Abdomen: soft, non-tender; bowel sounds  normal; no masses,  no organomegaly   Extremities: Nontender, no edema, no calf tenderness      Assessment:    Pregnancy: G2P1001 Patient Active Problem List   Diagnosis Date Noted  . Supervision of normal pregnancy 01/22/2014  . Subclinical hypothyroidism 09/04/2013  . Ocular migraine 07/16/2013     Plan:     Initial labs drawn including TSH. Continue chewable vitamins. Problem list reviewed and updated. Genetic Screening discussed Quad Screen: to be ordered later. Ultrasound discussed; fetal survey: to be ordered later. Follow up in 4 weeks.  Routine obstetric precautions reviewed.  Tereso NewcomerUgonna A [name], MD 01/22/2014

## 2014-01-22 NOTE — Patient Instructions (Signed)
Pregnancy - First Trimester During sexual intercourse, millions of sperm go into the vagina. Only 1 sperm will penetrate and fertilize the female egg while it is in the Fallopian tube. One week later, the fertilized egg implants into the wall of the uterus. An embryo begins to develop into a baby. At 6 to 8 weeks, the eyes and face are formed and the heartbeat can be seen on ultrasound. At the end of 12 weeks (first trimester), all the baby's organs are formed. Now that you are pregnant, you will want to do everything you can to have a healthy baby. Two of the most important things are to get good prenatal care and follow your caregiver's instructions. Prenatal care is all the medical care you receive before the baby's birth. It is given to prevent, find, and treat problems during the pregnancy and childbirth. PRENATAL EXAMS  During prenatal visits, your weight, blood pressure, and urine are checked. This is done to make sure you are healthy and progressing normally during the pregnancy.  A pregnant woman should gain 25 to 35 pounds during the pregnancy. However, if you are overweight or underweight, your caregiver will advise you regarding your weight.  Your caregiver will ask and answer questions for you.  Blood work, cervical cultures, other necessary tests, and a Pap test are done during your prenatal exams. These tests are done to check on your health and the probable health of your baby. Tests are strongly recommended and done for HIV with your permission. This is the virus that causes AIDS. These tests are done because medicines can be given to help prevent your baby from being born with this infection should you have been infected without knowing it. Blood work is also used to find out your blood type, previous infections, and follow your blood levels (hemoglobin).  Low hemoglobin (anemia) is common during pregnancy. Iron and vitamins are given to help prevent this. Later in the pregnancy,  blood tests for diabetes will be done along with any other tests if any problems develop.  You may need other tests to make sure you and the baby are doing well. CHANGES DURING THE FIRST TRIMESTER  Your body goes through many changes during pregnancy. They vary from person to person. Talk to your caregiver about changes you notice and are concerned about. Changes can include:  Your menstrual period stops.  The egg and sperm carry the genes that determine what you look like. Genes from you and your partner are forming a baby. The female genes determine whether the baby is a boy or a girl.  Your body increases in girth and you may feel bloated.  Feeling sick to your stomach (nauseous) and throwing up (vomiting). If the vomiting is uncontrollable, call your caregiver.  Your breasts will begin to enlarge and become tender.  Your nipples may stick out more and become darker.  The need to urinate more. Painful urination may mean you have a bladder infection.  Tiring easily.  Loss of appetite.  Cravings for certain kinds of food.  At first, you may gain or lose a couple of pounds.  You may have changes in your emotions from day to day (excited to be pregnant or concerned something may go wrong with the pregnancy and baby).  You may have more vivid and strange dreams. HOME CARE INSTRUCTIONS   It is very important to avoid all smoking, alcohol and non-prescribed drugs during your pregnancy. These affect the formation and growth of the baby.   Avoid chemicals while pregnant to ensure the delivery of a healthy infant.  Start your prenatal visits by the 12th week of pregnancy. They are usually scheduled monthly at first, then more often in the last 2 months before delivery. Keep your caregiver's appointments. Follow your caregiver's instructions regarding medicine use, blood and lab tests, exercise, and diet.  During pregnancy, you are providing food for you and your baby. Eat regular,  well-balanced meals. Choose foods such as meat, fish, milk and other low fat dairy products, vegetables, fruits, and whole-grain breads and cereals. Your caregiver will tell you of the ideal weight gain.  You can help morning sickness by keeping soda crackers at the bedside. Eat a couple before arising in the morning. You may want to use the crackers without salt on them.  Eating 4 to 5 small meals rather than 3 large meals a day also may help the nausea and vomiting.  Drinking liquids between meals instead of during meals also seems to help nausea and vomiting.  A physical sexual relationship may be continued throughout pregnancy if there are no other problems. Problems may be early (premature) leaking of amniotic fluid from the membranes, vaginal bleeding, or belly (abdominal) pain.  Exercise regularly if there are no restrictions. Check with your caregiver or physical therapist if you are unsure of the safety of some of your exercises. Greater weight gain will occur in the last 2 trimesters of pregnancy. Exercising will help:  Control your weight.  Keep you in shape.  Prepare you for labor and delivery.  Help you lose your pregnancy weight after you deliver your baby.  Wear a good support or jogging bra for breast tenderness during pregnancy. This may help if worn during sleep too.  Ask when prenatal classes are available. Begin classes when they are offered.  Do not use hot tubs, steam rooms, or saunas.  Wear your seat belt when driving. This protects you and your baby if you are in an accident.  Avoid raw meat, uncooked cheese, cat litter boxes, and soil used by cats throughout the pregnancy. These carry germs that can cause birth defects in the baby.  The first trimester is a good time to visit your dentist for your dental health. Getting your teeth cleaned is okay. Use a softer toothbrush and brush gently during pregnancy.  Ask for help if you have financial, counseling, or  nutritional needs during pregnancy. Your caregiver will be able to offer counseling for these needs as well as refer you for other special needs.  Do not take any medicines or herbs unless told by your caregiver.  Inform your caregiver if there is any mental or physical domestic violence.  Make a list of emergency phone numbers of family, friends, hospital, and police and fire departments.  Write down your questions. Take them to your prenatal visit.  Do not douche.  Do not cross your legs.  If you have to stand for long periods of time, rotate you feet or take small steps in a circle.  You may have more vaginal secretions that may require a sanitary pad. Do not use tampons or scented sanitary pads. MEDICINES AND DRUG USE IN PREGNANCY  Take prenatal vitamins as directed. The vitamin should contain 1 milligram of folic acid. Keep all vitamins out of reach of children. Only a couple vitamins or tablets containing iron may be fatal to a baby or young child when ingested.  Avoid use of all medicines, including herbs, over-the-counter medicines, not   prescribed or suggested by your caregiver. Only take over-the-counter or prescription medicines for pain, discomfort, or fever as directed by your caregiver. Do not use aspirin, ibuprofen, or naproxen unless directed by your caregiver.  Let your caregiver also know about herbs you may be using.  Alcohol is related to a number of birth defects. This includes fetal alcohol syndrome. All alcohol, in any form, should be avoided completely. Smoking will cause low birth rate and premature babies.  Street or illegal drugs are very harmful to the baby. They are absolutely forbidden. A baby born to an addicted mother will be addicted at birth. The baby will go through the same withdrawal an adult does.  Let your caregiver know about any medicines that you have to take and for what reason you take them. SEEK MEDICAL CARE IF:  You have any concerns or  worries during your pregnancy. It is better to call with your questions if you feel they cannot wait, rather than worry about them. SEEK IMMEDIATE MEDICAL CARE IF:   An unexplained oral temperature above 102 F (38.9 C) develops, or as your caregiver suggests.  You have leaking of fluid from the vagina (birth canal). If leaking membranes are suspected, take your temperature and inform your caregiver of this when you call.  There is vaginal spotting or bleeding. Notify your caregiver of the amount and how many pads are used.  You develop a bad smelling vaginal discharge with a change in the color.  You continue to feel sick to your stomach (nauseated) and have no relief from remedies suggested. You vomit blood or coffee ground-like materials.  You lose more than 2 pounds of weight in 1 week.  You gain more than 2 pounds of weight in 1 week and you notice swelling of your face, hands, feet, or legs.  You gain 5 pounds or more in 1 week (even if you do not have swelling of your hands, face, legs, or feet).  You get exposed to German measles and have never had them.  You are exposed to fifth disease or chickenpox.  You develop belly (abdominal) pain. Round ligament discomfort is a common non-cancerous (benign) cause of abdominal pain in pregnancy. Your caregiver still must evaluate this.  You develop headache, fever, diarrhea, pain with urination, or shortness of breath.  You fall or are in a car accident or have any kind of trauma.  There is mental or physical violence in your home. Document Released: 11/28/2001 Document Revised: 08/28/2012 Document Reviewed: 06/01/2009 [employer] Patient Information 2014 [employer].  Breastfeeding Deciding to breastfeed is one of the best choices you can make for you and your baby. A change in hormones during pregnancy causes your breast tissue to grow and increases the number and size of your milk ducts. These hormones also allow proteins,  sugars, and fats from your blood supply to make breast milk in your milk-producing glands. Hormones prevent breast milk from being released before your baby is born as well as prompt milk flow after birth. Once breastfeeding has begun, thoughts of your baby, as well as his or her sucking or crying, can stimulate the release of milk from your milk-producing glands.  BENEFITS OF BREASTFEEDING For Your Baby  Your first milk (colostrum) helps your baby's digestive system function better.   There are antibodies in your milk that help your baby fight off infections.   Your baby has a lower incidence of asthma, allergies, and sudden infant death syndrome.   The nutrients   in breast milk are better for your baby than infant formulas and are designed uniquely for your baby's needs.   Breast milk improves your baby's brain development.   Your baby is less likely to develop other conditions, such as childhood obesity, asthma, or type 2 diabetes mellitus.  For You   Breastfeeding helps to create a very special bond between you and your baby.   Breastfeeding is convenient. Breast milk is always available at the correct temperature and costs nothing.   Breastfeeding helps to burn calories and helps you lose the weight gained during pregnancy.   Breastfeeding makes your uterus contract to its prepregnancy size faster and slows bleeding (lochia) after you give birth.   Breastfeeding helps to lower your risk of developing type 2 diabetes mellitus, osteoporosis, and breast or ovarian cancer later in life. SIGNS THAT YOUR BABY IS HUNGRY Early Signs of Hunger  Increased alertness or activity.  Stretching.  Movement of the head from side to side.  Movement of the head and opening of the mouth when the corner of the mouth or cheek is stroked (rooting).  Increased sucking sounds, smacking lips, cooing, sighing, or squeaking.  Hand-to-mouth movements.  Increased sucking of fingers or  hands. Late Signs of Hunger  Fussing.  Intermittent crying. Extreme Signs of Hunger Signs of extreme hunger will require calming and consoling before your baby will be able to breastfeed successfully. Do not wait for the following signs of extreme hunger to occur before you initiate breastfeeding:   Restlessness.  A loud, strong cry.   Screaming. BREASTFEEDING BASICS Breastfeeding Initiation  Find a comfortable place to sit or lie down, with your neck and back well supported.  Place a pillow or rolled up blanket under your baby to bring him or her to the level of your breast (if you are seated). Nursing pillows are specially designed to help support your arms and your baby while you breastfeed.  Make sure that your baby's abdomen is facing your abdomen.   Gently massage your breast. With your fingertips, massage from your chest wall toward your nipple in a circular motion. This encourages milk flow. You may need to continue this action during the feeding if your milk flows slowly.  Support your breast with 4 fingers underneath and your thumb above your nipple. Make sure your fingers are well away from your nipple and your baby's mouth.   Stroke your baby's lips gently with your finger or nipple.   When your baby's mouth is open wide enough, quickly bring your baby to your breast, placing your entire nipple and as much of the colored area around your nipple (areola) as possible into your baby's mouth.   More areola should be visible above your baby's upper lip than below the lower lip.   Your baby's tongue should be between his or her lower gum and your breast.   Ensure that your baby's mouth is correctly positioned around your nipple (latched). Your baby's lips should create a seal on your breast and be turned out (everted).  It is common for your baby to suck about 2 3 minutes in order to start the flow of breast milk. Latching Teaching your baby how to latch on to your  breast properly is very important. An improper latch can cause nipple pain and decreased milk supply for you and poor weight gain in your baby. Also, if your baby is not latched onto your nipple properly, he or she may swallow some air during   feeding. This can make your baby fussy. Burping your baby when you switch breasts during the feeding can help to get rid of the air. However, teaching your baby to latch on properly is still the best way to prevent fussiness from swallowing air while breastfeeding. Signs that your baby has successfully latched on to your nipple:    Silent tugging or silent sucking, without causing you pain.   Swallowing heard between every 3 4 sucks.    Muscle movement above and in front of his or her ears while sucking.  Signs that your baby has not successfully latched on to nipple:   Sucking sounds or smacking sounds from your baby while breastfeeding.  Nipple pain. If you think your baby has not latched on correctly, slip your finger into the corner of your baby's mouth to break the suction and place it between your baby's gums. Attempt breastfeeding initiation again. Signs of Successful Breastfeeding Signs from your baby:   A gradual decrease in the number of sucks or complete cessation of sucking.   Falling asleep.   Relaxation of his or her body.   Retention of a small amount of milk in his or her mouth.   Letting go of your breast by himself or herself. Signs from you:  Breasts that have increased in firmness, weight, and size 1 3 hours after feeding.   Breasts that are softer immediately after breastfeeding.  Increased milk volume, as well as a change in milk consistency and color by the 5th day of breastfeeding.   Nipples that are not sore, cracked, or bleeding. Signs That Your Baby is Getting Enough Milk  Wetting at least 3 diapers in a 24-hour period. The urine should be clear and pale yellow by age 5 days.  At least 3 stools in a  24-hour period by age 5 days. The stool should be soft and yellow.  At least 3 stools in a 24-hour period by age 7 days. The stool should be seedy and yellow.  No loss of weight greater than 10% of birth weight during the first 3 days of age.  Average weight gain of 4 7 ounces (120 210 mL) per week after age 4 days.  Consistent daily weight gain by age 5 days, without weight loss after the age of 2 weeks. After a feeding, your baby may spit up a small amount. This is common. BREASTFEEDING FREQUENCY AND DURATION Frequent feeding will help you make more milk and can prevent sore nipples and breast engorgement. Breastfeed when you feel the need to reduce the fullness of your breasts or when your baby shows signs of hunger. This is called "breastfeeding on demand." Avoid introducing a pacifier to your baby while you are working to establish breastfeeding (the first 4 6 weeks after your baby is born). After this time you may choose to use a pacifier. Research has shown that pacifier use during the first year of a baby's life decreases the risk of sudden infant death syndrome (SIDS). Allow your baby to feed on each breast as long as he or she wants. Breastfeed until your baby is finished feeding. When your baby unlatches or falls asleep while feeding from the first breast, offer the second breast. Because newborns are often sleepy in the first few weeks of life, you may need to awaken your baby to get him or her to feed. Breastfeeding times will vary from baby to baby. However, the following rules can serve as a guide to help you   ensure that your baby is properly fed:  Newborns (babies 4 weeks of age or younger) may breastfeed every 1 3 hours.  Newborns should not go longer than 3 hours during the day or 5 hours during the night without breastfeeding.  You should breastfeed your baby a minimum of 8 times in a 24-hour period until you begin to introduce solid foods to your baby at around 6 months of  age. BREAST MILK PUMPING Pumping and storing breast milk allows you to ensure that your baby is exclusively fed your breast milk, even at times when you are unable to breastfeed. This is especially important if you are going back to work while you are still breastfeeding or when you are not able to be present during feedings. Your lactation consultant can give you guidelines on how long it is safe to store breast milk.  A breast pump is a machine that allows you to pump milk from your breast into a sterile bottle. The pumped breast milk can then be stored in a refrigerator or freezer. Some breast pumps are operated by hand, while others use electricity. Ask your lactation consultant which type will work best for you. Breast pumps can be purchased, but some hospitals and breastfeeding support groups lease breast pumps on a monthly basis. A lactation consultant can teach you how to hand express breast milk, if you prefer not to use a pump.  CARING FOR YOUR BREASTS WHILE YOU BREASTFEED Nipples can become dry, cracked, and sore while breastfeeding. The following recommendations can help keep your breasts moisturized and healthy:  Avoid using soap on your nipples.   Wear a supportive bra. Although not required, special nursing bras and tank tops are designed to allow access to your breasts for breastfeeding without taking off your entire bra or top. Avoid wearing underwire style bras or extremely tight bras.  Air dry your nipples for 3 4minutes after each feeding.   Use only cotton bra pads to absorb leaked breast milk. Leaking of breast milk between feedings is normal.   Use lanolin on your nipples after breastfeeding. Lanolin helps to maintain your skin's normal moisture barrier. If you use pure lanolin you do not need to wash it off before feeding your baby again. Pure lanolin is not toxic to your baby. You may also hand express a few drops of breast milk and gently massage that milk into your  nipples and allow the milk to air dry. In the first few weeks after giving birth, some women experience extremely full breasts (engorgement). Engorgement can make your breasts feel heavy, warm, and tender to the touch. Engorgement peaks within 3 5 days after you give birth. The following recommendations can help ease engorgement:  Completely empty your breasts while breastfeeding or pumping. You may want to start by applying warm, moist heat (in the shower or with warm water-soaked hand towels) just before feeding or pumping. This increases circulation and helps the milk flow. If your baby does not completely empty your breasts while breastfeeding, pump any extra milk after he or she is finished.  Wear a snug bra (nursing or regular) or tank top for 1 2 days to signal your body to slightly decrease milk production.  Apply ice packs to your breasts, unless this is too uncomfortable for you.  Make sure that your baby is latched on and positioned properly while breastfeeding. If engorgement persists after 48 hours of following these recommendations, contact your health care provider or a lactation consultant. OVERALL   HEALTH CARE RECOMMENDATIONS WHILE BREASTFEEDING  Eat healthy foods. Alternate between meals and snacks, eating 3 of each per day. Because what you eat affects your breast milk, some of the foods may make your baby more irritable than usual. Avoid eating these foods if you are sure that they are negatively affecting your baby.  Drink milk, fruit juice, and water to satisfy your thirst (about 10 glasses a day).   Rest often, relax, and continue to take your prenatal vitamins to prevent fatigue, stress, and anemia.  Continue breast self-awareness checks.  Avoid chewing and smoking tobacco.  Avoid alcohol and drug use. Some medicines that may be harmful to your baby can pass through breast milk. It is important to ask your health care provider before taking any medicine, including all  over-the-counter and prescription medicine as well as vitamin and herbal supplements. It is possible to become pregnant while breastfeeding. If birth control is desired, ask your health care provider about options that will be safe for your baby. SEEK MEDICAL CARE IF:   You feel like you want to stop breastfeeding or have become frustrated with breastfeeding.  You have painful breasts or nipples.  Your nipples are cracked or bleeding.  Your breasts are red, tender, or warm.  You have a swollen area on either breast.  You have a fever or chills.  You have nausea or vomiting.  You have drainage other than breast milk from your nipples.  Your breasts do not become full before feedings by the 5th day after you give birth.  You feel sad and depressed.  Your baby is too sleepy to eat well.  Your baby is having trouble sleeping.   Your baby is wetting less than 3 diapers in a 24-hour period.  Your baby has less than 3 stools in a 24-hour period.  Your baby's skin or the white part of his or her eyes becomes yellow.   Your baby is not gaining weight by 5 days of age. SEEK IMMEDIATE MEDICAL CARE IF:   Your baby is overly tired (lethargic) and does not want to wake up and feed.  Your baby develops an unexplained fever. Document Released: 12/04/2005 Document Revised: 08/06/2013 Document Reviewed: 05/28/2013 [employer] Patient Information 2014 [employer].  

## 2014-01-22 NOTE — Progress Notes (Signed)
P-75  Patient is here for New OB  She had last pap here in October.  Unable to tolerate prenatal vitamins she will try to get chewable form and see if this helps.  Otherwise no concerns.  Bedside ultrasound show CRL of 8wks and 2 days which correlates with the gestational sac size as well.  Patients periods are consistantly irregular.

## 2014-01-23 LAB — OBSTETRIC PANEL
ANTIBODY SCREEN: NEGATIVE
BASOS ABS: 0 10*3/uL (ref 0.0–0.1)
BASOS PCT: 0 % (ref 0–1)
Eosinophils Absolute: 0 10*3/uL (ref 0.0–0.7)
Eosinophils Relative: 1 % (ref 0–5)
HEMATOCRIT: 34 % — AB (ref 36.0–46.0)
Hemoglobin: 11.6 g/dL — ABNORMAL LOW (ref 12.0–15.0)
Hepatitis B Surface Ag: NEGATIVE
Lymphocytes Relative: 18 % (ref 12–46)
Lymphs Abs: 1.2 10*3/uL (ref 0.7–4.0)
MCH: 28.2 pg (ref 26.0–34.0)
MCHC: 34.1 g/dL (ref 30.0–36.0)
MCV: 82.7 fL (ref 78.0–100.0)
MONO ABS: 0.8 10*3/uL (ref 0.1–1.0)
Monocytes Relative: 12 % (ref 3–12)
NEUTROS ABS: 4.4 10*3/uL (ref 1.7–7.7)
NEUTROS PCT: 69 % (ref 43–77)
PLATELETS: 220 10*3/uL (ref 150–400)
RBC: 4.11 MIL/uL (ref 3.87–5.11)
RDW: 13.8 % (ref 11.5–15.5)
Rh Type: POSITIVE
Rubella: 1.02 Index — ABNORMAL HIGH (ref <0.90)
WBC: 6.4 10*3/uL (ref 4.0–10.5)

## 2014-01-23 LAB — CULTURE, OB URINE
Colony Count: NO GROWTH
ORGANISM ID, BACTERIA: NO GROWTH

## 2014-01-23 LAB — GC/CHLAMYDIA PROBE AMP
CT PROBE, AMP APTIMA: NEGATIVE
GC Probe RNA: NEGATIVE

## 2014-01-23 LAB — HIV ANTIBODY (ROUTINE TESTING W REFLEX): HIV: NONREACTIVE

## 2014-01-23 LAB — TSH: TSH: 1.181 u[IU]/mL (ref 0.350–4.500)

## 2014-02-19 ENCOUNTER — Ambulatory Visit (INDEPENDENT_AMBULATORY_CARE_PROVIDER_SITE_OTHER): Payer: BC Managed Care – PPO | Admitting: Obstetrics & Gynecology

## 2014-02-19 VITALS — BP 112/67 | Wt 122.8 lb

## 2014-02-19 DIAGNOSIS — J209 Acute bronchitis, unspecified: Secondary | ICD-10-CM

## 2014-02-19 DIAGNOSIS — R059 Cough, unspecified: Secondary | ICD-10-CM

## 2014-02-19 DIAGNOSIS — F4321 Adjustment disorder with depressed mood: Secondary | ICD-10-CM

## 2014-02-19 DIAGNOSIS — R05 Cough: Secondary | ICD-10-CM

## 2014-02-19 DIAGNOSIS — Z3480 Encounter for supervision of other normal pregnancy, unspecified trimester: Secondary | ICD-10-CM

## 2014-02-19 DIAGNOSIS — E038 Other specified hypothyroidism: Secondary | ICD-10-CM

## 2014-02-19 DIAGNOSIS — E039 Hypothyroidism, unspecified: Secondary | ICD-10-CM

## 2014-02-19 DIAGNOSIS — F329 Major depressive disorder, single episode, unspecified: Secondary | ICD-10-CM

## 2014-02-19 DIAGNOSIS — Z3490 Encounter for supervision of normal pregnancy, unspecified, unspecified trimester: Secondary | ICD-10-CM

## 2014-02-19 DIAGNOSIS — F3289 Other specified depressive episodes: Secondary | ICD-10-CM

## 2014-02-19 DIAGNOSIS — Z0000 Encounter for general adult medical examination without abnormal findings: Secondary | ICD-10-CM

## 2014-02-19 DIAGNOSIS — G43809 Other migraine, not intractable, without status migrainosus: Secondary | ICD-10-CM

## 2014-02-19 DIAGNOSIS — H539 Unspecified visual disturbance: Secondary | ICD-10-CM

## 2014-02-19 DIAGNOSIS — G459 Transient cerebral ischemic attack, unspecified: Secondary | ICD-10-CM

## 2014-02-19 DIAGNOSIS — J309 Allergic rhinitis, unspecified: Secondary | ICD-10-CM

## 2014-02-19 NOTE — Progress Notes (Signed)
P= 80 

## 2014-02-19 NOTE — Patient Instructions (Signed)
Return to clinic for any obstetric concerns or go to [facility] for evaluation  

## 2014-02-19 NOTE — Progress Notes (Signed)
Normal TSH, no need for intervention. No other complaints or concerns.  Routine obstetric precautions reviewed.  Quad screen next visit.  Anatomy scan ordered.

## 2014-03-24 ENCOUNTER — Encounter: Payer: Self-pay | Admitting: Family Medicine

## 2014-03-24 ENCOUNTER — Ambulatory Visit (INDEPENDENT_AMBULATORY_CARE_PROVIDER_SITE_OTHER): Payer: BC Managed Care – PPO | Admitting: Family Medicine

## 2014-03-24 VITALS — BP 119/74 | Wt 126.0 lb

## 2014-03-24 DIAGNOSIS — R05 Cough: Secondary | ICD-10-CM

## 2014-03-24 DIAGNOSIS — R059 Cough, unspecified: Secondary | ICD-10-CM

## 2014-03-24 DIAGNOSIS — Z3480 Encounter for supervision of other normal pregnancy, unspecified trimester: Secondary | ICD-10-CM

## 2014-03-24 DIAGNOSIS — G459 Transient cerebral ischemic attack, unspecified: Secondary | ICD-10-CM

## 2014-03-24 DIAGNOSIS — J209 Acute bronchitis, unspecified: Secondary | ICD-10-CM

## 2014-03-24 DIAGNOSIS — F4321 Adjustment disorder with depressed mood: Secondary | ICD-10-CM

## 2014-03-24 DIAGNOSIS — Z0000 Encounter for general adult medical examination without abnormal findings: Secondary | ICD-10-CM

## 2014-03-24 DIAGNOSIS — G43809 Other migraine, not intractable, without status migrainosus: Secondary | ICD-10-CM

## 2014-03-24 DIAGNOSIS — J309 Allergic rhinitis, unspecified: Secondary | ICD-10-CM

## 2014-03-24 DIAGNOSIS — Z3490 Encounter for supervision of normal pregnancy, unspecified, unspecified trimester: Secondary | ICD-10-CM

## 2014-03-24 DIAGNOSIS — F3289 Other specified depressive episodes: Secondary | ICD-10-CM

## 2014-03-24 DIAGNOSIS — H539 Unspecified visual disturbance: Secondary | ICD-10-CM

## 2014-03-24 DIAGNOSIS — E038 Other specified hypothyroidism: Secondary | ICD-10-CM

## 2014-03-24 DIAGNOSIS — F329 Major depressive disorder, single episode, unspecified: Secondary | ICD-10-CM

## 2014-03-24 NOTE — Patient Instructions (Signed)
Second Trimester of Pregnancy The second trimester is from week 13 through week 28, months 4 through 6. The second trimester is often a time when you feel your best. Your body has also adjusted to being pregnant, and you begin to feel better physically. Usually, morning sickness has lessened or quit completely, you may have more energy, and you may have an increase in appetite. The second trimester is also a time when the fetus is growing rapidly. At the end of the sixth month, the fetus is about 9 inches long and weighs about 1 pounds. You will likely begin to feel the baby move (quickening) between 18 and 20 weeks of the pregnancy. BODY CHANGES Your body goes through many changes during pregnancy. The changes vary from woman to woman.   Your weight will continue to increase. You will notice your lower abdomen bulging out.  You may begin to get stretch marks on your hips, abdomen, and breasts.  You may develop headaches that can be relieved by medicines approved by your caregiver.  You may urinate more often because the fetus is pressing on your bladder.  You may develop or continue to have heartburn as a result of your pregnancy.  You may develop constipation because certain hormones are causing the muscles that push waste through your intestines to slow down.  You may develop hemorrhoids or swollen, bulging veins (varicose veins).  You may have back pain because of the weight gain and pregnancy hormones relaxing your joints between the bones in your pelvis and as a result of a shift in weight and the muscles that support your balance.  Your breasts will continue to grow and be tender.  Your gums may bleed and may be sensitive to brushing and flossing.  Dark spots or blotches (chloasma, mask of pregnancy) may develop on your face. This will likely fade after the baby is born.  A dark line from your belly button to the pubic area (linea nigra) may appear. This will likely fade after  the baby is born. WHAT TO EXPECT AT YOUR PRENATAL VISITS During a routine prenatal visit:  You will be weighed to make sure you and the fetus are growing normally.  Your blood pressure will be taken.  Your abdomen will be measured to track your baby's growth.  The fetal heartbeat will be listened to.  Any test results from the previous visit will be discussed. Your caregiver may ask you:  How you are feeling.  If you are feeling the baby move.  If you have had any abnormal symptoms, such as leaking fluid, bleeding, severe headaches, or abdominal cramping.  If you have any questions. Other tests that may be performed during your second trimester include:  Blood tests that check for:  Low iron levels (anemia).  Gestational diabetes (between 24 and 28 weeks).  Rh antibodies.  Urine tests to check for infections, diabetes, or protein in the urine.  An ultrasound to confirm the proper growth and development of the baby.  An amniocentesis to check for possible genetic problems.  Fetal screens for spina bifida and Down syndrome. HOME CARE INSTRUCTIONS   Avoid all smoking, herbs, alcohol, and unprescribed drugs. These chemicals affect the formation and growth of the baby.  Follow your caregiver's instructions regarding medicine use. There are medicines that are either safe or unsafe to take during pregnancy.  Exercise only as directed by your caregiver. Experiencing uterine cramps is a good sign to stop exercising.  Continue to eat regular,   healthy meals.  Wear a good support bra for breast tenderness.  Do not use hot tubs, steam rooms, or saunas.  Wear your seat belt at all times when driving.  Avoid raw meat, uncooked cheese, cat litter boxes, and soil used by cats. These carry germs that can cause birth defects in the baby.  Take your prenatal vitamins.  Try taking a stool softener (if your caregiver approves) if you develop constipation. Eat more high-fiber  foods, such as fresh vegetables or fruit and whole grains. Drink plenty of fluids to keep your urine clear or pale yellow.  Take warm sitz baths to soothe any pain or discomfort caused by hemorrhoids. Use hemorrhoid cream if your caregiver approves.  If you develop varicose veins, wear support hose. Elevate your feet for 15 minutes, 3 4 times a day. Limit salt in your diet.  Avoid heavy lifting, wear low heel shoes, and practice good posture.  Rest with your legs elevated if you have leg cramps or low back pain.  Visit your dentist if you have not gone yet during your pregnancy. Use a soft toothbrush to brush your teeth and be gentle when you floss.  A sexual relationship may be continued unless your caregiver directs you otherwise.  Continue to go to all your prenatal visits as directed by your caregiver. SEEK MEDICAL CARE IF:   You have dizziness.  You have mild pelvic cramps, pelvic pressure, or nagging pain in the abdominal area.  You have persistent nausea, vomiting, or diarrhea.  You have a bad smelling vaginal discharge.  You have pain with urination. SEEK IMMEDIATE MEDICAL CARE IF:   You have a fever.  You are leaking fluid from your vagina.  You have spotting or bleeding from your vagina.  You have severe abdominal cramping or pain.  You have rapid weight gain or loss.  You have shortness of breath with chest pain.  You notice sudden or extreme swelling of your face, hands, ankles, feet, or legs.  You have not felt your baby move in over an hour.  You have severe headaches that do not go away with medicine.  You have vision changes. Document Released: 11/28/2001 Document Revised: 08/06/2013 Document Reviewed: 02/04/2013 [employer] Patient Information 2014 [employer].  Breastfeeding Deciding to breastfeed is one of the best choices you can make for you and your baby. A change in hormones during pregnancy causes your breast tissue to grow and increases the  number and size of your milk ducts. These hormones also allow proteins, sugars, and fats from your blood supply to make breast milk in your milk-producing glands. Hormones prevent breast milk from being released before your baby is born as well as prompt milk flow after birth. Once breastfeeding has begun, thoughts of your baby, as well as his or her sucking or crying, can stimulate the release of milk from your milk-producing glands.  BENEFITS OF BREASTFEEDING For Your Baby  Your first milk (colostrum) helps your baby's digestive system function better.   There are antibodies in your milk that help your baby fight off infections.   Your baby has a lower incidence of asthma, allergies, and sudden infant death syndrome.   The nutrients in breast milk are better for your baby than infant formulas and are designed uniquely for your baby's needs.   Breast milk improves your baby's brain development.   Your baby is less likely to develop other conditions, such as childhood obesity, asthma, or type 2 diabetes mellitus.  For   You   Breastfeeding helps to create a very special bond between you and your baby.   Breastfeeding is convenient. Breast milk is always available at the correct temperature and costs nothing.   Breastfeeding helps to burn calories and helps you lose the weight gained during pregnancy.   Breastfeeding makes your uterus contract to its prepregnancy size faster and slows bleeding (lochia) after you give birth.   Breastfeeding helps to lower your risk of developing type 2 diabetes mellitus, osteoporosis, and breast or ovarian cancer later in life. SIGNS THAT YOUR BABY IS HUNGRY Early Signs of Hunger  Increased alertness or activity.  Stretching.  Movement of the head from side to side.  Movement of the head and opening of the mouth when the corner of the mouth or cheek is stroked (rooting).  Increased sucking sounds, smacking lips, cooing, sighing, or  squeaking.  Hand-to-mouth movements.  Increased sucking of fingers or hands. Late Signs of Hunger  Fussing.  Intermittent crying. Extreme Signs of Hunger Signs of extreme hunger will require calming and consoling before your baby will be able to breastfeed successfully. Do not wait for the following signs of extreme hunger to occur before you initiate breastfeeding:   Restlessness.  A loud, strong cry.   Screaming. BREASTFEEDING BASICS Breastfeeding Initiation  Find a comfortable place to sit or lie down, with your neck and back well supported.  Place a pillow or rolled up blanket under your baby to bring him or her to the level of your breast (if you are seated). Nursing pillows are specially designed to help support your arms and your baby while you breastfeed.  Make sure that your baby's abdomen is facing your abdomen.   Gently massage your breast. With your fingertips, massage from your chest wall toward your nipple in a circular motion. This encourages milk flow. You may need to continue this action during the feeding if your milk flows slowly.  Support your breast with 4 fingers underneath and your thumb above your nipple. Make sure your fingers are well away from your nipple and your baby's mouth.   Stroke your baby's lips gently with your finger or nipple.   When your baby's mouth is open wide enough, quickly bring your baby to your breast, placing your entire nipple and as much of the colored area around your nipple (areola) as possible into your baby's mouth.   More areola should be visible above your baby's upper lip than below the lower lip.   Your baby's tongue should be between his or her lower gum and your breast.   Ensure that your baby's mouth is correctly positioned around your nipple (latched). Your baby's lips should create a seal on your breast and be turned out (everted).  It is common for your baby to suck about 2 3 minutes in order to start the  flow of breast milk. Latching Teaching your baby how to latch on to your breast properly is very important. An improper latch can cause nipple pain and decreased milk supply for you and poor weight gain in your baby. Also, if your baby is not latched onto your nipple properly, he or she may swallow some air during feeding. This can make your baby fussy. Burping your baby when you switch breasts during the feeding can help to get rid of the air. However, teaching your baby to latch on properly is still the best way to prevent fussiness from swallowing air while breastfeeding. Signs that your baby has   successfully latched on to your nipple:    Silent tugging or silent sucking, without causing you pain.   Swallowing heard between every 3 4 sucks.    Muscle movement above and in front of his or her ears while sucking.  Signs that your baby has not successfully latched on to nipple:   Sucking sounds or smacking sounds from your baby while breastfeeding.  Nipple pain. If you think your baby has not latched on correctly, slip your finger into the corner of your baby's mouth to break the suction and place it between your baby's gums. Attempt breastfeeding initiation again. Signs of Successful Breastfeeding Signs from your baby:   A gradual decrease in the number of sucks or complete cessation of sucking.   Falling asleep.   Relaxation of his or her body.   Retention of a small amount of milk in his or her mouth.   Letting go of your breast by himself or herself. Signs from you:  Breasts that have increased in firmness, weight, and size 1 3 hours after feeding.   Breasts that are softer immediately after breastfeeding.  Increased milk volume, as well as a change in milk consistency and color by the 5th day of breastfeeding.   Nipples that are not sore, cracked, or bleeding. Signs That Your Baby is Getting Enough Milk  Wetting at least 3 diapers in a 24-hour period. The urine  should be clear and pale yellow by age 5 days.  At least 3 stools in a 24-hour period by age 5 days. The stool should be soft and yellow.  At least 3 stools in a 24-hour period by age 7 days. The stool should be seedy and yellow.  No loss of weight greater than 10% of birth weight during the first 3 days of age.  Average weight gain of 4 7 ounces (120 210 mL) per week after age 4 days.  Consistent daily weight gain by age 5 days, without weight loss after the age of 2 weeks. After a feeding, your baby may spit up a small amount. This is common. BREASTFEEDING FREQUENCY AND DURATION Frequent feeding will help you make more milk and can prevent sore nipples and breast engorgement. Breastfeed when you feel the need to reduce the fullness of your breasts or when your baby shows signs of hunger. This is called "breastfeeding on demand." Avoid introducing a pacifier to your baby while you are working to establish breastfeeding (the first 4 6 weeks after your baby is born). After this time you may choose to use a pacifier. Research has shown that pacifier use during the first year of a baby's life decreases the risk of sudden infant death syndrome (SIDS). Allow your baby to feed on each breast as long as he or she wants. Breastfeed until your baby is finished feeding. When your baby unlatches or falls asleep while feeding from the first breast, offer the second breast. Because newborns are often sleepy in the first few weeks of life, you may need to awaken your baby to get him or her to feed. Breastfeeding times will vary from baby to baby. However, the following rules can serve as a guide to help you ensure that your baby is properly fed:  Newborns (babies 4 weeks of age or younger) may breastfeed every 1 3 hours.  Newborns should not go longer than 3 hours during the day or 5 hours during the night without breastfeeding.  You should breastfeed your baby a minimum of   8 times in a 24-hour period until  you begin to introduce solid foods to your baby at around 6 months of age. BREAST MILK PUMPING Pumping and storing breast milk allows you to ensure that your baby is exclusively fed your breast milk, even at times when you are unable to breastfeed. This is especially important if you are going back to work while you are still breastfeeding or when you are not able to be present during feedings. Your lactation consultant can give you guidelines on how long it is safe to store breast milk.  A breast pump is a machine that allows you to pump milk from your breast into a sterile bottle. The pumped breast milk can then be stored in a refrigerator or freezer. Some breast pumps are operated by hand, while others use electricity. Ask your lactation consultant which type will work best for you. Breast pumps can be purchased, but some hospitals and breastfeeding support groups lease breast pumps on a monthly basis. A lactation consultant can teach you how to hand express breast milk, if you prefer not to use a pump.  CARING FOR YOUR BREASTS WHILE YOU BREASTFEED Nipples can become dry, cracked, and sore while breastfeeding. The following recommendations can help keep your breasts moisturized and healthy:  Avoid using soap on your nipples.   Wear a supportive bra. Although not required, special nursing bras and tank tops are designed to allow access to your breasts for breastfeeding without taking off your entire bra or top. Avoid wearing underwire style bras or extremely tight bras.  Air dry your nipples for 3 4minutes after each feeding.   Use only cotton bra pads to absorb leaked breast milk. Leaking of breast milk between feedings is normal.   Use lanolin on your nipples after breastfeeding. Lanolin helps to maintain your skin's normal moisture barrier. If you use pure lanolin you do not need to wash it off before feeding your baby again. Pure lanolin is not toxic to your baby. You may also hand express a  few drops of breast milk and gently massage that milk into your nipples and allow the milk to air dry. In the first few weeks after giving birth, some women experience extremely full breasts (engorgement). Engorgement can make your breasts feel heavy, warm, and tender to the touch. Engorgement peaks within 3 5 days after you give birth. The following recommendations can help ease engorgement:  Completely empty your breasts while breastfeeding or pumping. You may want to start by applying warm, moist heat (in the shower or with warm water-soaked hand towels) just before feeding or pumping. This increases circulation and helps the milk flow. If your baby does not completely empty your breasts while breastfeeding, pump any extra milk after he or she is finished.  Wear a snug bra (nursing or regular) or tank top for 1 2 days to signal your body to slightly decrease milk production.  Apply ice packs to your breasts, unless this is too uncomfortable for you.  Make sure that your baby is latched on and positioned properly while breastfeeding. If engorgement persists after 48 hours of following these recommendations, contact your health care provider or a lactation consultant. OVERALL HEALTH CARE RECOMMENDATIONS WHILE BREASTFEEDING  Eat healthy foods. Alternate between meals and snacks, eating 3 of each per day. Because what you eat affects your breast milk, some of the foods may make your baby more irritable than usual. Avoid eating these foods if you are sure that they are   negatively affecting your baby.  Drink milk, fruit juice, and water to satisfy your thirst (about 10 glasses a day).   Rest often, relax, and continue to take your prenatal vitamins to prevent fatigue, stress, and anemia.  Continue breast self-awareness checks.  Avoid chewing and smoking tobacco.  Avoid alcohol and drug use. Some medicines that may be harmful to your baby can pass through breast milk. It is important to ask your  health care provider before taking any medicine, including all over-the-counter and prescription medicine as well as vitamin and herbal supplements. It is possible to become pregnant while breastfeeding. If birth control is desired, ask your health care provider about options that will be safe for your baby. SEEK MEDICAL CARE IF:   You feel like you want to stop breastfeeding or have become frustrated with breastfeeding.  You have painful breasts or nipples.  Your nipples are cracked or bleeding.  Your breasts are red, tender, or warm.  You have a swollen area on either breast.  You have a fever or chills.  You have nausea or vomiting.  You have drainage other than breast milk from your nipples.  Your breasts do not become full before feedings by the 5th day after you give birth.  You feel sad and depressed.  Your baby is too sleepy to eat well.  Your baby is having trouble sleeping.   Your baby is wetting less than 3 diapers in a 24-hour period.  Your baby has less than 3 stools in a 24-hour period.  Your baby's skin or the white part of his or her eyes becomes yellow.   Your baby is not gaining weight by 5 days of age. SEEK IMMEDIATE MEDICAL CARE IF:   Your baby is overly tired (lethargic) and does not want to wake up and feed.  Your baby develops an unexplained fever. Document Released: 12/04/2005 Document Revised: 08/06/2013 Document Reviewed: 05/28/2013 [employer] Patient Information 2014 [employer].  

## 2014-03-24 NOTE — Progress Notes (Signed)
Has anatomy scheduled this week Quad screen today

## 2014-03-24 NOTE — Progress Notes (Signed)
P = 87 

## 2014-03-26 ENCOUNTER — Ambulatory Visit (HOSPITAL_COMMUNITY)
Admission: RE | Admit: 2014-03-26 | Discharge: 2014-03-26 | Disposition: A | Discharge status: Home / Self Care | Payer: BC Managed Care – PPO | Source: Ambulatory Visit | Attending: Obstetrics & Gynecology | Admitting: Obstetrics & Gynecology

## 2014-03-26 ENCOUNTER — Other Ambulatory Visit: Payer: Self-pay | Admitting: Obstetrics & Gynecology

## 2014-03-26 DIAGNOSIS — Z3490 Encounter for supervision of normal pregnancy, unspecified, unspecified trimester: Secondary | ICD-10-CM

## 2014-03-26 DIAGNOSIS — Z1389 Encounter for screening for other disorder: Secondary | ICD-10-CM | POA: Insufficient documentation

## 2014-03-26 DIAGNOSIS — O350XX0 Maternal care for (suspected) central nervous system malformation in fetus, not applicable or unspecified: Secondary | ICD-10-CM | POA: Diagnosis present

## 2014-03-26 DIAGNOSIS — O358XX0 Maternal care for other (suspected) fetal abnormality and damage, not applicable or unspecified: Secondary | ICD-10-CM

## 2014-03-26 DIAGNOSIS — O3500X0 Maternal care for (suspected) central nervous system malformation or damage in fetus, unspecified, not applicable or unspecified: Secondary | ICD-10-CM | POA: Diagnosis present

## 2014-03-26 DIAGNOSIS — Z332 Encounter for elective termination of pregnancy: Principal | ICD-10-CM

## 2014-03-26 DIAGNOSIS — O039 Complete or unspecified spontaneous abortion without complication: Principal | ICD-10-CM | POA: Diagnosis present

## 2014-03-26 DIAGNOSIS — Z363 Encounter for antenatal screening for malformations: Secondary | ICD-10-CM | POA: Insufficient documentation

## 2014-03-26 NOTE — Progress Notes (Signed)
03/26/14 1100  Clinical Encounter Type  Visited With Patient and family together;Health care provider  Visit Type (unexpected U/S result: anencephaly)  Referral From Nurse Eldred Manges(Jeanne Miklos, radiology)  Spiritual Encounters  Spiritual Needs Grief support;Emotional  Stress Factors  Patient Stress Factors Major life changes;Loss of control (facing loss)  Family Stress Factors Major life changes;Loss of control (facing loss)   Ultrasound paged chaplain for support as family was learning about unexpected finding of anencephaly.  Visited briefly with Kelsey HummerCharlene and husband Kelsey JohnBrian to offer pastoral presence and spiritual/emotional support in between other providers' conversations with them.  Provided my card with encouragement to reach out any time for further support and space to process, reminding them that Spiritual Care is part of their support team.  Caro Highthaplain Benedict Kue, [place] [phone]

## 2014-03-27 ENCOUNTER — Encounter (HOSPITAL_COMMUNITY): Payer: Self-pay

## 2014-03-27 ENCOUNTER — Inpatient Hospital Stay (HOSPITAL_COMMUNITY)
Admission: RE | Admit: 2014-03-27 | Discharge: 2014-03-28 | DRG: 779 | Disposition: A | Discharge status: Home / Self Care | Payer: BC Managed Care – PPO | Source: Ambulatory Visit | Attending: Family Medicine | Admitting: Family Medicine

## 2014-03-27 VITALS — BP 110/61 | HR 90 | Temp 99.0°F | Resp 18 | Ht 66.0 in | Wt 126.0 lb

## 2014-03-27 DIAGNOSIS — Z3490 Encounter for supervision of normal pregnancy, unspecified, unspecified trimester: Secondary | ICD-10-CM

## 2014-03-27 DIAGNOSIS — Z87728 Personal history of other specified (corrected) congenital malformations of nervous system and sense organs: Secondary | ICD-10-CM | POA: Diagnosis present

## 2014-03-27 DIAGNOSIS — O039 Complete or unspecified spontaneous abortion without complication: Secondary | ICD-10-CM

## 2014-03-27 DIAGNOSIS — O3500X0 Maternal care for (suspected) central nervous system malformation or damage in fetus, unspecified, not applicable or unspecified: Secondary | ICD-10-CM

## 2014-03-27 DIAGNOSIS — O350XX0 Maternal care for (suspected) central nervous system malformation in fetus, not applicable or unspecified: Secondary | ICD-10-CM

## 2014-03-27 DIAGNOSIS — Z332 Encounter for elective termination of pregnancy: Secondary | ICD-10-CM

## 2014-03-27 LAB — AFP, QUAD SCREEN
AFP: 500 [IU]/mL
CURR GEST AGE: 17 wks.days
HCG, Total: 17210 m[IU]/mL
INH: 155.2 pg/mL
INTERPRETATION-AFP: NEGATIVE
MOM FOR INH: 0.72
MoM for AFP: 13.15
MoM for hCG: 0.76
Open Spina bifida: POSITIVE
Osb Risk: >1:5 {titer}
Tri 18 Scr Risk Est: POSITIVE — AB
Trisomy 18 (Edward) Syndrome Interp.: 1:95 {titer}
UE3 MOM: 0.32
uE3 Value: 0.2 ng/mL

## 2014-03-27 LAB — CBC
HEMATOCRIT: 31.7 % — AB (ref 36.0–46.0)
HEMOGLOBIN: 10.9 g/dL — AB (ref 12.0–15.0)
MCH: 29.5 pg (ref 26.0–34.0)
MCHC: 34.4 g/dL (ref 30.0–36.0)
MCV: 85.9 fL (ref 78.0–100.0)
Platelets: 193 10*3/uL (ref 150–400)
RBC: 3.69 MIL/uL — AB (ref 3.87–5.11)
RDW: 14.1 % (ref 11.5–15.5)
WBC: 8.3 10*3/uL (ref 4.0–10.5)

## 2014-03-27 MED ORDER — ONDANSETRON HCL 4 MG/2ML IJ SOLN: 4.0000 mg | Freq: Four times a day (QID) | INTRAMUSCULAR | Status: DC | PRN

## 2014-03-27 MED ORDER — OXYTOCIN 40 UNITS IN LACTATED RINGERS INFUSION - SIMPLE MED
62.5000 mL/h | INTRAVENOUS | Status: DC
  Filled 2014-03-27: qty 1000

## 2014-03-27 MED ORDER — PROMETHAZINE HCL 25 MG/ML IJ SOLN
12.5000 mg | Freq: Four times a day (QID) | INTRAMUSCULAR | Status: DC | PRN
  Administered 2014-03-27: 12.5 mg via INTRAVENOUS
  Filled 2014-03-27: qty 1

## 2014-03-27 MED ORDER — ACETAMINOPHEN 325 MG PO TABS: 650.0000 mg | ORAL_TABLET | ORAL | Status: DC | PRN

## 2014-03-27 MED ORDER — LACTATED RINGERS IV SOLN: 500.0000 mL | INTRAVENOUS | Status: DC | PRN

## 2014-03-27 MED ORDER — LACTATED RINGERS IV SOLN
INTRAVENOUS | Status: DC
  Administered 2014-03-27 (×3): via INTRAVENOUS

## 2014-03-27 MED ORDER — ZOLPIDEM TARTRATE 5 MG PO TABS: 5.0000 mg | ORAL_TABLET | Freq: Every evening | ORAL | Status: DC | PRN

## 2014-03-27 MED ORDER — CITRIC ACID-SODIUM CITRATE 334-500 MG/5ML PO SOLN: 30.0000 mL | ORAL | Status: DC | PRN

## 2014-03-27 MED ORDER — MISOPROSTOL 200 MCG PO TABS
ORAL_TABLET | ORAL | Status: AC
  Filled 2014-03-27: qty 4

## 2014-03-27 MED ORDER — TERBUTALINE SULFATE 1 MG/ML IJ SOLN: 0.2500 mg | Freq: Once | INTRAMUSCULAR | Status: AC | PRN

## 2014-03-27 MED ORDER — OXYTOCIN BOLUS FROM INFUSION
500.0000 mL | INTRAVENOUS | Status: DC
  Administered 2014-03-27: 500 mL via INTRAVENOUS

## 2014-03-27 MED ORDER — OXYCODONE-ACETAMINOPHEN 5-325 MG PO TABS: 1.0000 | ORAL_TABLET | ORAL | Status: DC | PRN

## 2014-03-27 MED ORDER — MISOPROSTOL 200 MCG PO TABS
600.0000 ug | ORAL_TABLET | ORAL | Status: DC
  Administered 2014-03-27 (×3): 600 ug via VAGINAL
  Filled 2014-03-27 (×3): qty 3

## 2014-03-27 MED ORDER — NALBUPHINE HCL 10 MG/ML IJ SOLN
10.0000 mg | INTRAMUSCULAR | Status: DC | PRN
  Administered 2014-03-27: 10 mg via INTRAVENOUS
  Filled 2014-03-27: qty 1

## 2014-03-27 MED ORDER — FLEET ENEMA 7-19 GM/118ML RE ENEM: 1.0000 | ENEMA | RECTAL | Status: DC | PRN

## 2014-03-27 MED ORDER — LIDOCAINE HCL (PF) 1 % IJ SOLN
30.0000 mL | INTRAMUSCULAR | Status: DC | PRN
  Filled 2014-03-27: qty 30

## 2014-03-27 MED ORDER — MISOPROSTOL 200 MCG PO TABS
800.0000 ug | ORAL_TABLET | Freq: Once | ORAL | Status: AC
  Administered 2014-03-27: 800 ug via RECTAL

## 2014-03-27 MED ORDER — IBUPROFEN 600 MG PO TABS: 600.0000 mg | ORAL_TABLET | Freq: Four times a day (QID) | ORAL | Status: DC | PRN

## 2014-03-27 NOTE — Progress Notes (Signed)
Patient ID: Kelsey Anthony, female   DOB: 1981/07/12, 33 y.o.   MRN: [id number] Kelsey Anthony is a 33 y.o. G2P1001 at 1255w3d admitted for IOL indicated by anencephaly  Subjective: Comfortable. Feels some pelvic pressure.  Objective: BP 120/56  Pulse 90  Temp(Src) 98.5 F (36.9 C) (Oral)  Resp 20  Ht 5\' 6"  (1.676 m)  Wt 57.153 kg (126 lb)  BMI 20.35 kg/m2  LMP 11/01/2013    Contractions: none noted  SVE:   Dilation: 1.5 Effacement (%): 80 Station: +1 Exam by:: Dammon Makarewicz, CNM Cytotec tablets in vagina undissolved. Placed 600 mcg partially crushed tablets toward posterior fornix  Assessment / Plan:  Labor: n/a. Cx more favorable  Pain Control:  n/a Expected mode of delivery: NSVD  Kelsey Anthony 03/27/2014, 4:49 PM

## 2014-03-27 NOTE — H&P (Signed)
  Kelsey Anthony is an 33 y.o. G2P1001 1575w3d female.    Chief Complaint: anencephaly  HPI: Here for termination of pregnancy due to anencephaly found on anatomy u/s.  Past Medical History  Diagnosis Date  . HLD (hyperlipidemia)     borderline  . Ocular migraine 07/2013  . Hx-TIA (transient ischemic attack) 07/08/13  . TIA (transient ischemic attack) 07/16/2013    Past Surgical History  Procedure Laterality Date  . No past surgeries      Family History  Problem Relation Age of Onset  . Depression Mother   . Depression Sister   . Heart disease Maternal Grandmother     open heart surgery  . Cancer Maternal Grandfather     prostate s/p seeds  . Diabetes Paternal Grandmother   . Diabetes Maternal Grandmother   . Hypertension Father   . Hyperlipidemia Neg Hx   . Stroke Neg Hx    Social History:  reports that she has never smoked. She has never used smokeless tobacco. She reports that she drinks about .6 ounces of alcohol per week. She reports that she does not use illicit drugs.  Allergies:  Allergies  Allergen Reactions  . Amoxicillin Hives and Rash    Muscle aches  . Penicillins Hives and Rash    Muscle aches    No current facility-administered medications on file prior to encounter.   Current Outpatient Prescriptions on File Prior to Encounter  Medication Sig Dispense Refill  . acetaminophen (TYLENOL) 325 MG tablet Take 325 mg by mouth every 6 (six) hours as needed for pain.      Burnis Medin. Prenat w/o A Vit-FeFum-FePo-FA (PROVIDA OB) 20-20-1.25 MG CAPS Take 1 capsule by mouth daily.  30 capsule  11    Pertinent items are noted in HPI.  Last menstrual period 11/01/2013. LMP 11/01/2013 General appearance: alert, cooperative and appears stated age Head: Normocephalic, without obvious abnormality, atraumatic Neck: supple, symmetrical, trachea midline Lungs: normal effort Heart: regular rate and rhythm Abdomen: soft, non-tender; bowel sounds normal; no masses,  no  organomegaly Extremities: extremities normal, atraumatic, no cyanosis or edema Neurologic: Grossly normal   Lab Results  Component Value Date   WBC 6.4 01/22/2014   HGB 11.6* 01/22/2014   HCT 34.0* 01/22/2014   MCV 82.7 01/22/2014   PLT 220 01/22/2014   Lab Results  Component Value Date   PREGTESTUR NEGATIVE 07/15/2013   HCG 17210.0 03/24/2014     Assessment/Plan Patient Active Problem List   Diagnosis Date Noted  . Supervision of normal pregnancy 01/22/2014    Priority: High  . Subclinical hypothyroidism 09/04/2013    Priority: Medium  . Normal delivery 03/27/2014  . Fetal anencephaly in pregnancy, antepartum 03/27/2014  . Ocular migraine 07/16/2013   For cytotec IOL.  Kelsey Anthony 03/27/2014, 8:15 AM

## 2014-03-27 NOTE — Progress Notes (Signed)
Patient ID: Kelsey Anthony, female   DOB: 06/16/1981, 33 y.o.   MRN: [id number] Kelsey Anthony is a 33 y.o. G2P1001 at 1933w3d admitted for IOL indicated by anencephaly  Subjective: More pelvic pressure. No urge to push. Declines any analgesia.  Objective: BP 86/70  Pulse 86  Temp(Src) 98.5 F (36.9 C) (Oral)  Resp 18  Ht 5\' 6"  (1.676 m)  Wt 57.153 kg (126 lb)  BMI 20.35 kg/m2  LMP 11/01/2013    Contractions: abd soft  SVE:   Dilation: 1.5 Effacement (%): 80 Station: +1 Exam by:: Harla Mensch, CNM Not reexamined  Assessment / Plan:  Labor: Cytotec IOL in progress  Pain Control:  declines med Expected mode of delivery: NSVD  Lis Savitt C Garett Tetzloff 03/27/2014, 7:44 PM

## 2014-03-27 NOTE — Progress Notes (Signed)
Chaplain responded to spiritual care consult and direct page from patient's nurse. Patient and her husband, Arlys JohnBrian, are here for termination of pregnancy due to anencephaly. Will deliver today. Patient and husband were very tearful, quiet. Chaplain offered pastoral presence, listening empathically and compassionately as they process their grief, providing emotional/spiritual support. Will follow up throughout the day, please page if immediately needed or requested by Westley Hummerharlene or Arlys JohnBrian. Will also refer to Caro Highthaplain Lisa Lundeen for follow up this afternoon.   Maurene CapesHillary D Irusta [phone]

## 2014-03-27 NOTE — Progress Notes (Signed)
Chaplain followed up with Kelsey Anthony and Kelsey Anthony, offering continual pastoral support. Kelsey Anthony said her pastor visited, and she was grateful for that. Both Kelsey Anthony and Kelsey Anthony said that family is what gets them through hard times. Chaplain provided emotional support and presence, listening compassionately.  Maurene CapesHillary D Irusta [phone]

## 2014-03-27 NOTE — Progress Notes (Signed)
03/27/14 1700  Clinical Encounter Type  Visited With Patient and family together (husband Kelsey Anthony ("BJ"))  Visit Type Follow-up;Spiritual support;Social support  Spiritual Encounters  Spiritual Needs Grief support;Emotional   Followed up with Kelsey Anthony and Kelsey Anthony after meeting them in Ultrasound yesterday, assuming care from [facility]usta, who supported them earlier today.  Answered pt's questions about care of baby's body after delivery.  If possible they would choose hospital cremation, but if that is not possible (that is, if baby is born with signs of life), then they are not sure yet whether they would choose burial or cremation.  We also talked about how they are supporting their daughter Kelsey Anthony, in 2nd grade, through her grief and healing.  Family is aware of ongoing chaplain availability as desired.  Kelsey Anthony and Kelsey Anthony also gave verbal permission for a follow-up phone call for support.  Please page [phone] for further chaplain visits.  Thank you.  [address]Chaplain Adiel Mcnamara [place], [place] [phone]

## 2014-03-28 ENCOUNTER — Encounter (HOSPITAL_COMMUNITY): Payer: Self-pay

## 2014-03-28 MED ORDER — IBUPROFEN 600 MG PO TABS: 600.0000 mg | ORAL_TABLET | Freq: Four times a day (QID) | ORAL | Status: DC | PRN

## 2014-03-28 NOTE — Discharge Instructions (Signed)

## 2014-03-28 NOTE — Discharge Summary (Signed)
Obstetric Discharge Summary Reason for Admission: induction of labor @ 2866w3d for anencephaly Prenatal Procedures: none Intrapartum Procedures: spontaneous vaginal delivery and manual extraction of the placenta Postpartum Procedures: none Complications-Operative and Postpartum: none Hemoglobin  Date Value Ref Range Status  03/27/2014 10.9* 12.0 - 15.0 g/dL Final     HCT  Date Value Ref Range Status  03/27/2014 31.7* 36.0 - 46.0 % Final    Physical Exam:  General: alert, cooperative and no distress Lochia: appropriate Uterine Fundus: firm DVT Evaluation: No evidence of DVT seen on physical exam.  Discharge Diagnoses: induction of labor for anencephaly @ 7366w3d  Discharge Information: Date: 03/28/2014 Activity: pelvic rest Diet: routine Medications: PNV and Ibuprofen Condition: stable Instructions: refer to practice specific booklet Discharge to: home Follow-up Information   [facility] Schedule an appointment as soon as possible for a visit in 4 weeks.   Specialty:  Obstetrics and Gynecology   Contact information:   [address] [place] [address] [phone]      Newborn Data: Pecola LeisureBaby with no heart beat and birth and sent to morgue.    Pt was found on anatomy scan to have a baby with anencephaly. Was given the options of elective termination, carrying to term, or induction of labor. The next AM, she elected to go ahead with induction of labor. She progressed on cytotec to deliver a nonviable female via NSVD as per delivery note.  Her postpartum care was uncomplicated and she was allowed to be discharged home the following AM. [facility]    Sharicka Pogorzelski L Cederic Mozley 03/28/2014, 2:23 AM

## 2014-03-28 NOTE — Progress Notes (Signed)
Assisted patient with getting dressed and obtaining VS; Fundus WNL with minimal bleeding; IV removed; went over discharge instructions with patient; patient denied and questions or concerns at this time; encouraged patient call if any questions or concerns arise, patient verbalized understanding; assisted patient to private vehicle with significant other at side

## 2014-03-30 ENCOUNTER — Encounter (HOSPITAL_COMMUNITY): Payer: Self-pay

## 2014-03-30 NOTE — Discharge Summary (Signed)
Attestation of Attending Supervision of Obstetric Fellow: Evaluation and management procedures were performed by the Obstetric Fellow under my supervision and collaboration.  I have reviewed the Obstetric Fellow's note and chart, and I agree with the management and plan.  Jacob Stinson, DO Attending Physician Faculty Practice, [facility]  

## 2014-03-30 NOTE — Progress Notes (Signed)
I spoke to patient this morning and she is upset but doing as well as to be expected.  I assured her she can call our office for any concerns that she may have or if she needs us for any questions or support.  She is appreciative and set up an appointment for follow up in three weeks as she was instructed to do while at the hospital.

## 2014-03-31 ENCOUNTER — Encounter: Payer: Self-pay | Admitting: Family Medicine

## 2014-04-04 ENCOUNTER — Ambulatory Visit: Payer: BLUE CROSS/BLUE SHIELD

## 2014-04-04 ENCOUNTER — Ambulatory Visit (INDEPENDENT_AMBULATORY_CARE_PROVIDER_SITE_OTHER): Payer: BLUE CROSS/BLUE SHIELD | Admitting: Family Medicine

## 2014-04-04 VITALS — BP 110/72 | HR 119 | Temp 99.3°F | Resp 18 | Ht 65.0 in | Wt 119.0 lb

## 2014-04-04 DIAGNOSIS — R059 Cough, unspecified: Secondary | ICD-10-CM

## 2014-04-04 DIAGNOSIS — J209 Acute bronchitis, unspecified: Secondary | ICD-10-CM

## 2014-04-04 DIAGNOSIS — R05 Cough: Secondary | ICD-10-CM

## 2014-04-04 DIAGNOSIS — R509 Fever, unspecified: Secondary | ICD-10-CM

## 2014-04-04 LAB — POCT CBC
Granulocyte percent: 71.9 %G (ref 37–80)
HCT, POC: 31.4 % — AB (ref 37.7–47.9)
Hemoglobin: 10.3 g/dL — AB (ref 12.2–16.2)
Lymph, poc: 1.5 (ref 0.6–3.4)
MCH, POC: 28.8 pg (ref 27–31.2)
MCHC: 32.8 g/dL (ref 31.8–35.4)
MCV: 87.8 fL (ref 80–97)
MID (cbc): 0.3 (ref 0–0.9)
MPV: 11.5 fL (ref 0–99.8)
POC Granulocyte: 4.5 (ref 2–6.9)
POC LYMPH PERCENT: 23.4 %L (ref 10–50)
POC MID %: 4.7 %M (ref 0–12)
Platelet Count, POC: 154 10*3/uL (ref 142–424)
RBC: 3.58 M/uL — AB (ref 4.04–5.48)
RDW, POC: 13.6 %
WBC: 6.2 10*3/uL (ref 4.6–10.2)

## 2014-04-04 MED ORDER — HYDROCODONE-HOMATROPINE 5-1.5 MG/5ML PO SYRP: 5.0000 mL | ORAL_SOLUTION | Freq: Three times a day (TID) | ORAL | Status: DC | PRN

## 2014-04-04 MED ORDER — PREDNISONE 20 MG PO TABS: ORAL_TABLET | ORAL | Status: DC

## 2014-04-04 MED ORDER — AZITHROMYCIN 250 MG PO TABS: ORAL_TABLET | ORAL | Status: DC

## 2014-04-04 NOTE — Progress Notes (Signed)
Subjective:   This chart was scribed for Kelsey SidleKurt Lauenstein, MD by Arlan OrganAshley Leger, [facility] Sc[facility]. This patient was seen in room 12 and the patient's care was started 9:45 AM.    Patient ID: Kelsey Anthony, female    DOB: 1981-11-15, 33 y.o.   MRN: [id number]  Chief Complaint  Patient presents with   Fever    x2 days    Chills   Cough   Sore Throat    HPI  HPI Comments: Kelsey Anthony is a 33 y.o. Female with a PMHx of HLD, ocular migraines, and TIA who [facility] complaining of fever x 2 days. She states her fever has been 101.8 at its highest. She also reports associated chills, mild cough, and mild sore throat. She has not tried anything OTC for her symptoms. Has She denies currently being a smoker. No other pertinent medical  History. No other concerns this visit.  Pt recently lost her baby secondary to an induced delivery due to Anencephaly  Past Medical History  Diagnosis Date   HLD (hyperlipidemia)     borderline   Ocular migraine 07/2013   Hx-TIA (transient ischemic attack) 07/08/13   TIA (transient ischemic attack) 07/16/2013    Review of Systems  Constitutional: Positive for fever and chills.  HENT: Positive for sore throat.   Eyes: Negative for redness.  Respiratory: Positive for cough.   Skin: Negative for rash.  Psychiatric/Behavioral: Negative for confusion.    Triage Vitals: BP 110/72   Pulse 119   Temp(Src) 99.3 F (37.4 C) (Oral)   Resp 18   Ht 5\' 5"  (1.651 m)   Wt 119 lb (53.978 kg)   BMI 19.80 kg/m2   SpO2 98%   LMP 11/01/2013  Objective:   Physical Exam  Nursing note and vitals reviewed. Constitutional: She is oriented to person, place, and time. She appears well-developed and well-nourished.  HENT:  Head: Normocephalic and atraumatic.  Eyes: EOM are normal.  Neck: Normal range of motion.  Cardiovascular: Normal rate, regular rhythm and normal heart sounds.  Exam reveals no gallop and  no friction rub.   No murmur heard. Pulmonary/Chest: Effort normal and breath sounds normal. No respiratory distress. She has no wheezes. She has no rales.  Musculoskeletal: Normal range of motion.  Neurological: She is alert and oriented to person, place, and time.  Skin: Skin is warm and dry.  Psychiatric: She has a normal mood and affect. Her behavior is normal.   Results for orders placed in visit on 04/04/14  POCT CBC      Result Value Ref Range   WBC 6.2  4.6 - 10.2 K/uL   Lymph, poc 1.5  0.6 - 3.4   POC LYMPH PERCENT 23.4  10 - 50 %L   MID (cbc) 0.3  0 - 0.9   POC MID % 4.7  0 - 12 %M   POC Granulocyte 4.5  2 - 6.9   Granulocyte percent 71.9  37 - 80 %G   RBC 3.58 (*) 4.04 - 5.48 M/uL   Hemoglobin 10.3 (*) 12.2 - 16.2 g/dL   HCT, POC 40.931.4 (*) 81.137.7 - 47.9 %   MCV 87.8  80 - 97 fL   MCH, POC 28.8  27 - 31.2 pg   MCHC 32.8  31.8 - 35.4 g/dL   RDW, POC 91.413.6     Platelet Count, POC 154  142 - 424 K/uL   MPV  11.5  0 - 99.8 fL   [facility] reading (PRIMARY) by  Dr. Milus GlazierLauenstein:  Heavy markings with increased aeration.      Assessment & Plan:   I personally performed the services described in this documentation, which was scribed in my presence. The recorded information has been reviewed and is accurate.  Acute bronchitis - Plan: azithromycin (ZITHROMAX Z-PAK) 250 MG tablet, predniSONE (DELTASONE) 20 MG tablet, HYDROcodone-homatropine (HYCODAN) 5-1.5 MG/5ML syrup  Fever - Plan: POCT CBC, DG Chest 2 View, azithromycin (ZITHROMAX Z-PAK) 250 MG tablet  Cough - Plan: POCT CBC, DG Chest 2 View, HYDROcodone-homatropine (HYCODAN) 5-1.5 MG/5ML syrup  Signed, Kelsey SidleKurt Lauenstein, MD

## 2014-04-09 ENCOUNTER — Emergency Department (HOSPITAL_COMMUNITY)
Admission: EM | Admit: 2014-04-09 | Discharge: 2014-04-09 | Disposition: A | Discharge status: Home / Self Care | Payer: BC Managed Care – PPO | Source: Home / Self Care

## 2014-04-09 ENCOUNTER — Emergency Department (INDEPENDENT_AMBULATORY_CARE_PROVIDER_SITE_OTHER): Payer: BC Managed Care – PPO

## 2014-04-09 ENCOUNTER — Encounter (HOSPITAL_COMMUNITY): Payer: Self-pay | Admitting: Emergency Medicine

## 2014-04-09 DIAGNOSIS — F32A Depression, unspecified: Secondary | ICD-10-CM

## 2014-04-09 DIAGNOSIS — F329 Major depressive disorder, single episode, unspecified: Secondary | ICD-10-CM

## 2014-04-09 DIAGNOSIS — R059 Cough, unspecified: Secondary | ICD-10-CM

## 2014-04-09 DIAGNOSIS — J309 Allergic rhinitis, unspecified: Secondary | ICD-10-CM

## 2014-04-09 DIAGNOSIS — F3289 Other specified depressive episodes: Secondary | ICD-10-CM

## 2014-04-09 DIAGNOSIS — R05 Cough: Secondary | ICD-10-CM

## 2014-04-09 MED ORDER — LEVOFLOXACIN 500 MG PO TABS: 500.0000 mg | ORAL_TABLET | Freq: Every day | ORAL | Status: DC

## 2014-04-09 MED ORDER — ALBUTEROL SULFATE HFA 108 (90 BASE) MCG/ACT IN AERS: 1.0000 | INHALATION_SPRAY | Freq: Four times a day (QID) | RESPIRATORY_TRACT | Status: DC | PRN

## 2014-04-09 MED ORDER — MECLIZINE HCL 32 MG PO TABS: 32.0000 mg | ORAL_TABLET | Freq: Three times a day (TID) | ORAL | Status: DC | PRN

## 2014-04-09 NOTE — ED Provider Notes (Signed)
CSN: [id number]     Arrival date & time 04/09/14  1551 History   First MD Initiated Contact with Patient 04/09/14 1658     Chief Complaint  Patient presents with  . Cough   (Consider location/radiation/quality/duration/timing/severity/associated sxs/prior Treatment) HPI Comments: 33 yo WF with fever/ chills/ fatigue and increasing cough. She completed Zpak yesterday w/o any relief of symptoms. She denies SOB or production with cough. She notes mild ear fullness and dizziness with change of positions. She has not taken any OTC.   Sister notes patient lost baby 12 days ago and has been depressed since the stillbirth occurred. She notes she just wants to sleep and not go out of the house. She denies HX of depression.   Patient is a 33 y.o. female presenting with cough.  Cough Associated symptoms: ear pain     Past Medical History  Diagnosis Date  . HLD (hyperlipidemia)     borderline  . Ocular migraine 07/2013  . Hx-TIA (transient ischemic attack) 07/08/13  . TIA (transient ischemic attack) 07/16/2013   Past Surgical History  Procedure Laterality Date  . Induction of labor  2015    17 wk anencephaly   Family History  Problem Relation Age of Onset  . Depression Mother   . Mental illness Mother   . Depression Sister   . Heart disease Maternal Grandmother     open heart surgery  . Diabetes Maternal Grandmother   . Cancer Maternal Grandfather     prostate s/p seeds  . Diabetes Paternal Grandmother   . Hypertension Father   . Hyperlipidemia Neg Hx   . Stroke Neg Hx    History  Substance Use Topics  . Smoking status: Never Smoker   . Smokeless tobacco: Never Used  . Alcohol Use: 0.6 oz/week    1 Glasses of wine per week     Comment: Rare/social   OB History   Grav Para Term Preterm Abortions TAB SAB Ect Mult Living   2 2 1       1      Review of Systems  HENT: Positive for ear pain and postnasal drip.   Respiratory: Positive for cough.   Neurological: Positive for  dizziness.  All other systems reviewed and are negative.   Allergies  Amoxicillin and Penicillins  Home Medications   Prior to Admission medications   Medication Sig Start Date End Date Taking? Authorizing Provider  acetaminophen (TYLENOL) 500 MG tablet Take 500 mg by mouth every 6 (six) hours as needed.    Historical Provider, MD  azithromycin (ZITHROMAX Z-PAK) 250 MG tablet Take as directed on pack 04/04/14   Elvina SidleKurt Lauenstein, MD  HYDROcodone-homatropine [facility](HYCODAN) 5-1.5 MG/5ML syrup Take 5 mLs by mouth every 8 (eight) hours as needed for cough. 04/04/14   Elvina SidleKurt Lauenstein, MD  ibuprofen (ADVIL,MOTRIN) 600 MG tablet Take 1 tablet (600 mg total) by mouth every 6 (six) hours as needed (pain scale < 4). 03/28/14   Vale HavenKeli L Beck, MD  Multiple Vitamins-Calcium (ONE-A-DAY WOMENS PO) Take by mouth.    Historical Provider, MD  predniSONE (DELTASONE) 20 MG tablet 2 daily with food 04/04/14   Elvina SidleKurt Lauenstein, MD  Prenatal Vit-Fe Fumarate-FA (PRENATAL MULTIVITAMIN) TABS tablet Take 1 tablet by mouth daily at 12 noon.    Historical Provider, MD   BP 120/74  Pulse 110  Temp(Src) 98.5 F (36.9 C) (Oral)  Resp 24  SpO2 99%  LMP 11/01/2013 Physical Exam  Nursing note and vitals reviewed. Constitutional: She is  oriented to person, place, and time. She appears well-developed and well-nourished.  HENT:  Head: Normocephalic and atraumatic.  Right Ear: External ear normal.  Left Ear: External ear normal.  Nose: Nose normal.  Mouth/Throat: Oropharynx is clear and moist. No oropharyngeal exudate.  Cloudy TM's bilaterally mildly yellow  Eyes: Conjunctivae and EOM are normal.  Neck: Normal range of motion.  Cardiovascular: Normal rate, regular rhythm, normal heart sounds and intact distal pulses.   Pulmonary/Chest: Effort normal.  ? rhonchi  Musculoskeletal: Normal range of motion.  Lymphadenopathy:    She has no cervical adenopathy.  Neurological: She is alert and oriented to person, place, and time.   Skin: Skin is warm and dry.  Psychiatric: Her behavior is normal. Judgment normal.  Answers questions appropriately and agrees with sister that she is depressed.    ED Course  Procedures (including critical care time) Labs Review Labs Reviewed - No data to display  Results for orders placed in visit on 04/04/14  POCT CBC      Result Value Ref Range   WBC 6.2  4.6 - 10.2 K/uL   Lymph, poc 1.5  0.6 - 3.4   POC LYMPH PERCENT 23.4  10 - 50 %L   MID (cbc) 0.3  0 - 0.9   POC MID % 4.7  0 - 12 %M   POC Granulocyte 4.5  2 - 6.9   Granulocyte percent 71.9  37 - 80 %G   RBC 3.58 (*) 4.04 - 5.48 M/uL   Hemoglobin 10.3 (*) 12.2 - 16.2 g/dL   HCT, POC 81.131.4 (*) 91.437.7 - 47.9 %   MCV 87.8  80 - 97 fL   MCH, POC 28.8  27 - 31.2 pg   MCHC 32.8  31.8 - 35.4 g/dL   RDW, POC 78.213.6     Platelet Count, POC 154  142 - 424 K/uL   MPV 11.5  0 - 99.8 fL   Imaging Review Dg Chest 2 View  04/09/2014   CLINICAL DATA:  COUGH  EXAM: CHEST  2 VIEW  COMPARISON:  DG CHEST 2V dated 04/04/2014  FINDINGS: The heart size and mediastinal contours are within normal limits. Both lungs are clear. The visualized skeletal structures are unremarkable.  IMPRESSION: No active cardiopulmonary disease.   Electronically Signed   By: Salome HolmesHector  Cooper M.D.   On: 04/09/2014 18:19     MDM  1. ? Pneumonia with cough- Levaquin 500 mg AD, Push Fluids w/c if SX increase or ER.   2. Vertigo/ Allergic rhinitis- Allegra OTC/ Flonase OTC AD, increase H2o, allergy hygiene explained. Meclizine 25 mg AD. Advised of possibility of dehydration despite increased hydration efforts w/c if SX increase or ER.   3. Mild depression with recent loss of pregnancy- Advised of risks of complications with pregnancy loss, needs PCP f/u tomorrow for further workup with blood/ medications/ counseling.     Berenice PrimasMelissa R Lafayette Dunlevy, PA-C 04/09/14 1846

## 2014-04-09 NOTE — ED Provider Notes (Signed)
Medical screening examination/treatment/procedure(s) were performed by a resident physician or non-physician practitioner and as the supervising physician I was immediately available for consultation/collaboration.  Mariselda Badalamenti, MD    Musab Wingard S Neythan Kozlov, MD 04/09/14 2115 

## 2014-04-09 NOTE — ED Notes (Signed)
Pt reports   Symptoms  Of cough    Congestion          Dizzy  - body  Aches           As  Well  As          Headache   And  Non  Productive  Cough            Just  Finished  A  Course  Of z  Pack

## 2014-04-13 ENCOUNTER — Encounter: Payer: Self-pay | Admitting: Family Medicine

## 2014-04-13 ENCOUNTER — Ambulatory Visit (INDEPENDENT_AMBULATORY_CARE_PROVIDER_SITE_OTHER): Payer: BC Managed Care – PPO | Admitting: Family Medicine

## 2014-04-13 VITALS — BP 108/60 | HR 68 | Temp 98.0°F | Wt 117.5 lb

## 2014-04-13 DIAGNOSIS — F4321 Adjustment disorder with depressed mood: Secondary | ICD-10-CM

## 2014-04-13 DIAGNOSIS — J209 Acute bronchitis, unspecified: Secondary | ICD-10-CM

## 2014-04-13 NOTE — Assessment & Plan Note (Addendum)
Anticipate natural grieving process after loss of 6817 wk old unborn from anencephaly.  Discussed difference between grieving and depression/anxiety/panic. Supportive care, expect improvement with time. No need for pharmacotherapy Pt declines counseling. Has good support system in place. A total of 25 minutes were spent face-to-face with the patient during this encounter and over half of that time was spent on counseling and coordination of care

## 2014-04-13 NOTE — Assessment & Plan Note (Signed)
Failed zpack now completing levaquin course and improving. Supportive care discussed.

## 2014-04-13 NOTE — Patient Instructions (Signed)
I this is normal grieving process - should get better slowly. Let me know if any worsening or persistent mood trouble.  Grief Reaction Grief is a normal response to the death of someone close to you. Feelings of fear, anger, and guilt can affect almost everyone who loses someone they love. Symptoms of depression are also common. These include problems with sleep, loss of appetite, and lack of energy. These grief reaction symptoms often last for weeks to months after a loss. They may also return during special times that remind you of the person you lost, such as an anniversary or birthday. Anxiety, insomnia, irritability, and deep depression may last beyond the period of normal grief. If you experience these feelings for 6 months or longer, you may have clinical depression. Clinical depression requires further medical attention. If you think that you have clinical depression, you should contact your caregiver. If you have a history of depression and or a family history of depression, you are at greater risk of clinical depression. You are also at greater risk of developing clinical depression if the loss was traumatic or the loss was of someone with whom you had unresolved issues.  A grief reaction can become complicated by being blocked. This means being unable to cry or express extreme emotions. This may prolong the grieving period and worsen the emotional effects of the loss. Mourning is a natural event in human life. A healthy grief reaction is one that is not blocked . It requires a time of sadness and readjustment.It is very important to share your sorrow and fear with others, especially close friends and family. Professional counselors and clergy can also help you process your grief. Document Released: 12/04/2005 Document Revised: 02/26/2012 Document Reviewed: 08/14/2006 [facility] Patient Information 2014 [employer] [place].

## 2014-04-13 NOTE — Progress Notes (Signed)
BP 108/60  Pulse 68  Temp(Src) 98 F (36.7 C) (Oral)  Wt 117 lb 8 oz (53.298 kg)   CC: [facility] f/u  Subjective:    Patient ID: Kelsey Anthony, female    DOB: 1981-11-18, 33 y.o.   MRN: [id number]  HPI: Kelsey Anthony is a 33 y.o. female presenting on 04/13/2014 for Follow-up   Pleasant 33 yo with h/o TIA vs ocular migraine and recent pregnancy complicated by anencephaly s/p induction at 17wks [facility]. [facility] [place] [facility] and finally improving.  Not taking cough medication currently.  At that time concern for depression so she was referred here for further evaluation.  Was planning on returning to work - only took 2 wks out of possible 6 wks.  Having trouble thinking about return to work with recent illness and loss of baby.  Works at [employer] [address]. Has f/u in May with OBGYN.  Staying anxious - panicky.   Able to sleep well but occasional nightmares around IOL.  Appetite decreased due to recent illness.  Energy level down, concentration down.  Some anhedonia.  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   No dyspnea or headache.  Vaginal bleeding slowing down.  Will prolong out of work until 04/27/2014.  Past Medical History  Diagnosis Date  . HLD (hyperlipidemia)     borderline  . Ocular migraine 07/2013  . TIA (transient ischemic attack) 7/22 and 730/2014     Relevant past medical, surgical, family and social history reviewed and updated as indicated.  Allergies and medications reviewed and updated. Current Outpatient Prescriptions on File Prior to Visit  Medication Sig  . acetaminophen (TYLENOL) 500 MG tablet Take 500 mg by mouth every 6 (six) hours as needed.  Marland Kitchen. levofloxacin (LEVAQUIN) 500 MG tablet Take 1 tablet (500 mg total) by mouth daily.  . meclizine (ANTIVERT) 32 MG tablet Take 1 tablet (32 mg total) by mouth 3 (three) times daily as  needed.  . Multiple Vitamins-Calcium (ONE-A-DAY WOMENS PO) Take by mouth.   No current facility-administered medications on file prior to visit.    Review of Systems Per HPI unless specifically indicated above    Objective:    BP 108/60  Pulse 68  Temp(Src) 98 F (36.7 C) (Oral)  Wt 117 lb 8 oz (53.298 kg)  Physical Exam  Nursing note and vitals reviewed. Constitutional: She appears well-developed and well-nourished. No distress.  HENT:  Head: Normocephalic and atraumatic.  Right Ear: Hearing, tympanic membrane, external ear and ear canal normal.  Left Ear: Hearing, tympanic membrane, external ear and ear canal normal.  Nose: Nose normal. No mucosal edema or rhinorrhea. Right sinus exhibits no maxillary sinus tenderness and no frontal sinus tenderness. Left sinus exhibits no maxillary sinus tenderness and no frontal sinus tenderness.  Mouth/Throat: Uvula is midline, oropharynx is clear and moist and mucous membranes are normal. No oropharyngeal exudate, posterior oropharyngeal edema, posterior oropharyngeal erythema or tonsillar abscesses.  Eyes: Conjunctivae and EOM are normal. Pupils are equal, round, and reactive to light. No scleral icterus.  Neck: Normal range of motion. Neck supple.  Cardiovascular: Normal rate, regular rhythm, normal heart sounds and intact distal pulses.   No murmur heard. Pulmonary/Chest: Effort normal and breath sounds normal. No respiratory distress. She has no wheezes. She has no rales.  Lymphadenopathy:    She has no cervical adenopathy.  Skin: Skin is warm and dry. No rash noted.   Results for orders placed in visit on 04/04/14  POCT CBC      Result Value Ref Range   WBC 6.2  4.6 - 10.2 K/uL   Lymph, poc 1.5  0.6 - 3.4   POC LYMPH PERCENT 23.4  10 - 50 %L   MID (cbc) 0.3  0 - 0.9   POC MID % 4.7  0 - 12 %M   POC Granulocyte 4.5  2 - 6.9   Granulocyte percent 71.9  37 - 80 %G   RBC 3.58 (*) 4.04 - 5.48 M/uL   Hemoglobin 10.3 (*) 12.2 - 16.2  g/dL   HCT, POC 16.131.4 (*) 09.637.7 - 47.9 %   MCV 87.8  80 - 97 fL   MCH, POC 28.8  27 - 31.2 pg   MCHC 32.8  31.8 - 35.4 g/dL   RDW, POC 04.513.6     Platelet Count, POC 154  142 - 424 K/uL   MPV 11.5  0 - 99.8 fL      Assessment & Plan:   Problem List Items Addressed This Visit   Grieving - Primary     Anticipate natural grieving process after loss of 7617 wk old unborn from anencephaly.  Discussed difference between grieving and depression/anxiety/panic. Supportive care, expect improvement with time. No need for pharmacotherapy Pt declines counseling. Has good support system in place. A total of 25 minutes were spent face-to-face with the patient during this encounter and over half of that time was spent on counseling and coordination of care    Acute bronchitis     Failed zpack now completing levaquin course and improving. Supportive care discussed.        Follow up plan: Return if symptoms worsen or fail to improve.

## 2014-04-13 NOTE — Progress Notes (Signed)
Pre visit review using our clinic review tool, if applicable. No additional management support is needed unless otherwise documented below in the visit note. 

## 2014-04-24 ENCOUNTER — Encounter: Payer: Self-pay | Admitting: Obstetrics & Gynecology

## 2014-04-24 ENCOUNTER — Ambulatory Visit (INDEPENDENT_AMBULATORY_CARE_PROVIDER_SITE_OTHER): Payer: BC Managed Care – PPO | Admitting: Obstetrics & Gynecology

## 2014-04-24 VITALS — BP 105/72 | HR 70 | Ht 66.0 in | Wt 119.0 lb

## 2014-04-24 DIAGNOSIS — Z332 Encounter for elective termination of pregnancy: Secondary | ICD-10-CM

## 2014-04-24 DIAGNOSIS — O039 Complete or unspecified spontaneous abortion without complication: Secondary | ICD-10-CM

## 2014-04-24 DIAGNOSIS — Z87728 Personal history of other specified (corrected) congenital malformations of nervous system and sense organs: Secondary | ICD-10-CM

## 2014-04-24 MED ORDER — FOLIC ACID 1 MG PO TABS: 1.0000 mg | ORAL_TABLET | Freq: Every day | ORAL | Status: DC

## 2014-04-24 NOTE — Progress Notes (Signed)
   CLINIC ENCOUNTER NOTE  History:  33 y.o. G2P1001 here today for follow up and induction of labor for termination of pregnancy in the setting of fetal anencephaly at 2023w3d on 03/27/14.  Since discharge on 03/28/14, patient has [facility] and has been seen by her PCP  (Dr. Sharen HonesGutierrez) for further treatment of the bronchitis and for evaluation of depression; she was though to be grieving appropriately for her loss.  He felt there was no need for pharmocotherapy, and patient declined counseling.   Today, patient reports improved bronchitis symptoms.  Still grieving, denies any signs of major depression, HI or SI. Still has scant bleeding, no pain. No other concerns.  Husband and family are all grieving appropriately.  The following portions of the patient's history were reviewed and updated as appropriate: allergies, current medications, past family history, past medical history, past social history, past surgical history and problem list. Normal pap and negative HRHPV in 09/2013.  Review of Systems:  Pertinent items are noted in HPI.  Objective:  Physical Exam BP 105/72  Pulse 70  Ht 5\' 6"  (1.676 m)  Wt 119 lb (53.978 kg)  BMI 19.22 kg/m2 Gen: NAD Lungs: CTAB Heart: RRR Abd: Soft, nontender and nondistended Pelvic: Deferred   Assessment & Plan:  Patient is s/p IOL and delivery of a 17 week nonviable fetus with anencephaly.  Grieving appropriately, support given to patient. Depression precautions reviewed.  Recommended [facility] support group for her and her husband. Recommend increased folic acid intake prior to attempting conception again, to be taken in addition to prenatal vitamins.  Folic acid 1 mg po daily prescribed.   Contraception: Condoms for now, will let us know if she decides to use something else.  Has ocular migraines and two TIAs, not a good candidate for estrogen based contraception.  She is considering LARCs vs Depo Provera;  declines for now. Routine preventative health maintenance measures emphasized; return to clinic for any scheduled appointments or for any gynecologic concerns as needed.     Jaynie CollinsUGONNA  Chablis Losh, MD, FACOG Attending Obstetrician & Gynecologist Faculty Practice, [facility]

## 2014-04-24 NOTE — Patient Instructions (Signed)
Return to clinic for any scheduled appointments or for any gynecologic concerns as needed.   

## 2014-05-04 ENCOUNTER — Encounter (HOSPITAL_COMMUNITY): Payer: Self-pay

## 2014-05-04 ENCOUNTER — Inpatient Hospital Stay (HOSPITAL_COMMUNITY): Payer: BC Managed Care – PPO

## 2014-05-04 ENCOUNTER — Inpatient Hospital Stay (HOSPITAL_COMMUNITY)
Admission: AD | Admit: 2014-05-04 | Discharge: 2014-05-04 | Disposition: A | Discharge status: Home / Self Care | Payer: BC Managed Care – PPO | Source: Ambulatory Visit | Attending: Obstetrics & Gynecology | Admitting: Obstetrics & Gynecology

## 2014-05-04 DIAGNOSIS — O034 Incomplete spontaneous abortion without complication: Secondary | ICD-10-CM

## 2014-05-04 DIAGNOSIS — O071 Delayed or excessive hemorrhage following failed attempted termination of pregnancy: Principal | ICD-10-CM | POA: Insufficient documentation

## 2014-05-04 DIAGNOSIS — O039 Complete or unspecified spontaneous abortion without complication: Secondary | ICD-10-CM

## 2014-05-04 LAB — CBC
HCT: 29.1 % — ABNORMAL LOW (ref 36.0–46.0)
Hemoglobin: 9.6 g/dL — ABNORMAL LOW (ref 12.0–15.0)
MCH: 28.7 pg (ref 26.0–34.0)
MCHC: 33 g/dL (ref 30.0–36.0)
MCV: 86.9 fL (ref 78.0–100.0)
Platelets: 175 10*3/uL (ref 150–400)
RBC: 3.35 MIL/uL — AB (ref 3.87–5.11)
RDW: 14.1 % (ref 11.5–15.5)
WBC: 7.3 10*3/uL (ref 4.0–10.5)

## 2014-05-04 LAB — POCT PREGNANCY, URINE: PREG TEST UR: NEGATIVE

## 2014-05-04 MED ORDER — IBUPROFEN 600 MG PO TABS: 600.0000 mg | ORAL_TABLET | Freq: Four times a day (QID) | ORAL | Status: DC | PRN

## 2014-05-04 MED ORDER — HYDROCODONE-ACETAMINOPHEN 5-325 MG PO TABS: 1.0000 | ORAL_TABLET | ORAL | Status: DC | PRN

## 2014-05-04 MED ORDER — MISOPROSTOL 200 MCG PO TABS: 800.0000 ug | ORAL_TABLET | Freq: Once | ORAL | Status: DC

## 2014-05-04 NOTE — MAU Provider Note (Signed)
History     CSN: [id number]  Arrival date and time: 05/04/14 1451   First Provider Initiated Contact with Patient 05/04/14 1549      Chief Complaint  Patient presents with  . Vaginal Bleeding   HPI Comments: Kelsey Anthony is a 33 y.o. who presents with heavy bleeding. On 04/10, she had an IOL for termination of pregnancy d/t fetal anencephaly at 8336w3d. Since then she has had "sometimes heavy, sometimes light" bleeding every day with clots and product. She presents today because the bleeding has been abundant since this morning and soaked through her clothes. She has experienced mild cramping in her lower abdomen since 04/10, but "nothing concerning." She also reports feeling a little lightheaded this AM. She denies fever, chills, N/V, urinary or bowel sx. She and her partner had sexual intercourse twice about 3 weeks ago and used condoms both times. Pt has not completely stopped bleeding since the delivery.   Vaginal Bleeding Pertinent negatives include no abdominal pain, chills, constipation, diarrhea, dysuria, fever, frequency, headaches, hematuria, nausea, urgency or vomiting.     Past Medical History  Diagnosis Date  . HLD (hyperlipidemia)     borderline  . Ocular migraine 07/2013  . TIA (transient ischemic attack) 7/22 and 730/2014    Past Surgical History  Procedure Laterality Date  . Induction of labor  2015    17 wk anencephaly    Family History  Problem Relation Age of Onset  . Depression Mother   . Mental illness Mother   . Depression Sister   . Heart disease Maternal Grandmother     open heart surgery  . Diabetes Maternal Grandmother   . Cancer Maternal Grandfather     prostate s/p seeds  . Diabetes Paternal Grandmother   . Hypertension Father   . Hyperlipidemia Neg Hx   . Stroke Neg Hx     History  Substance Use Topics  . Smoking status: Never Smoker   . Smokeless tobacco: Never Used  . Alcohol Use: 0.6 oz/week    1 Glasses of wine per week      Comment: Rare/social    Allergies:  Allergies  Allergen Reactions  . Amoxicillin Hives and Rash    Muscle aches  . Penicillins Hives and Rash    Muscle aches    Prescriptions prior to admission  Medication Sig Dispense Refill  . acetaminophen (TYLENOL) 500 MG tablet Take 500 mg by mouth every 6 (six) hours as needed for headache.       . Multiple Vitamins-Calcium (ONE-A-DAY WOMENS PO) Take 1 tablet by mouth daily.       . folic acid (FOLVITE) 1 MG tablet Take 1 tablet (1 mg total) by mouth daily.  30 tablet  12   Results for orders placed during the hospital encounter of 05/04/14 (from the past 48 hour(s))  CBC     Status: Abnormal   Collection Time    05/04/14  4:05 PM      Result Value Ref Range   WBC 7.3  4.0 - 10.5 K/uL   RBC 3.35 (*) 3.87 - 5.11 MIL/uL   Hemoglobin 9.6 (*) 12.0 - 15.0 g/dL   HCT 40.929.1 (*) 81.136.0 - 91.446.0 %   MCV 86.9  78.0 - 100.0 fL   MCH 28.7  26.0 - 34.0 pg   MCHC 33.0  30.0 - 36.0 g/dL   RDW 78.214.1  95.611.5 - 21.315.5 %   Platelets 175  150 - 400 K/uL  POCT PREGNANCY,  URINE     Status: None   Collection Time    05/04/14  4:49 PM      Result Value Ref Range   Preg Test, Ur NEGATIVE  NEGATIVE   Comment:            THE SENSITIVITY OF THIS     METHODOLOGY IS >24 mIU/mL    [place] Transvaginal Non-ob  05/04/2014   CLINICAL DATA:  Delivery 03/27/2014.  Heavy bleeding stents.  EXAM: TRANSVAGINAL ULTRASOUND OF PELVIS  TECHNIQUE: Transvaginal ultrasound examination of the pelvis was performed including evaluation of the uterus, ovaries, adnexal regions, and pelvic cul-de-sac.  COMPARISON:  Fetal ultrasound 03/26/2014  FINDINGS: Uterus  Measurements: 8 x 5 x 6 cm. No fibroids or other mass visualized.  Endometrium  Thickness: Abnormal thickening of the fundic endometrial cavity up to 29 mm. Although predominantly nonvascular, some power Doppler flow seen at the right periphery of the abnormal thick.  Right ovary  Measurements: 4.6 x 3.1 x 4.1 cm. Size asymmetry is related to  a 4 cm cyst. No parenchymal edematous changes.  Left ovary  Measurements: 2.7 x 2.2 x 2.1 cm. Normal appearance/no adnexal mass.  Other findings:  No free fluid  IMPRESSION: 1. Probable retained products of conception. The fundic endometrial cavity is thickened to 3 cm by partially vascularized material. 2. 4 cm right ovarian cyst.   Electronically Signed   By: Tiburcio PeaJonathan  Watts M.D.   On: 05/04/2014 17:14    Review of Systems  Constitutional: Negative for fever, chills and malaise/fatigue.  Cardiovascular: Negative for chest pain and palpitations.  Gastrointestinal: Negative for nausea, vomiting, abdominal pain, diarrhea and constipation.  Genitourinary: Positive for vaginal bleeding. Negative for dysuria, urgency, frequency and hematuria.  Neurological: Positive for dizziness. Negative for headaches.   Physical Exam   Blood pressure 113/71, pulse 88, temperature 98.9 F (37.2 C), temperature source Oral, resp. rate 18, height 5' 4.5" (1.638 m), weight 55.248 kg (121 lb 12.8 oz), SpO2 97.00%, unknown if currently breastfeeding.  Physical Exam  Constitutional: She appears well-developed.  Cardiovascular: Normal rate and normal heart sounds.   Respiratory: Effort normal.  GI: Soft. There is tenderness (mildly TTP in hypogastric region).  Genitourinary:  Speculum exam: Vagina - Small amount of dark red blood in the vaginal canal; no clots.  no odor Cervix - + small active bleeding Bimanual exam: Cervix FT Uterus non tender, slightly enlarged Chaperone present for exam.   Neurological: She is alert.  Skin: Skin is warm and dry.  Psychiatric: She has a normal mood and affect.    MAU Course  Procedures None  MDM CBC, urinalysis U/S Discussed US findings with Dr. Penne LashLeggett; Will offer the patient cytotec and have her follow up in the clinic on Weds.  Discussed at length cytotec administration and bleeding precautions. Discussed the possibility of a D/E if cytotec fails.  A positive  blood type     Assessment and Plan  Assessment:  1. Retained products of conception following abortion     Plan:  Discharge home in stable condition RX: Ibuprofen        Cytotec        Vicodin #6 no refills  Follow up in the clinic on Wednesday at 3:00 pm I will call the patient tomorrow to check on her. Bleeding precautions  Kelsey Anthony 05/04/2014, 3:55 PM   Evaluation and management procedures were performed by the PA student under my supervision and collaboration. I have reviewed the note and chart,  and I agree with the management and plan.  Kelsey Hansen Rasch, NP 05/04/2014 7:01 PM

## 2014-05-04 NOTE — MAU Provider Note (Signed)
Attestation of Attending Supervision of Advanced Practitioner (CNM/NP): Evaluation and management procedures were performed by the Advanced Practitioner under my supervision and collaboration. I have reviewed the Advanced Practitioner's note and chart, and I agree with the management and plan.  Fredrich RomansKelly H Parris Signer 11:20 PM

## 2014-05-04 NOTE — MAU Note (Signed)
Patient states she delivered on 4-10 at 17 weeks. States she has had bleeding every day since that time. Can be heavy at times. Today went through clothing with clots. Denies pain.

## 2014-05-05 ENCOUNTER — Telehealth: Payer: Self-pay | Admitting: Obstetrics and Gynecology

## 2014-05-05 NOTE — Telephone Encounter (Signed)
Called to check on Kelsey Anthony following cytotec last evening. Bleeding minimal and pain well controlled at home with vicodin and Ibuprofen. Pt is scheduled in the clinic 5/20 at 1500 for follow up.

## 2014-05-06 ENCOUNTER — Ambulatory Visit (INDEPENDENT_AMBULATORY_CARE_PROVIDER_SITE_OTHER): Payer: BC Managed Care – PPO | Admitting: Obstetrics & Gynecology

## 2014-05-06 ENCOUNTER — Encounter: Payer: Self-pay | Admitting: Obstetrics & Gynecology

## 2014-05-06 MED ORDER — DEXTROSE 5 % IV SOLN: 100.0000 mg | Freq: Once | INTRAVENOUS | Status: DC

## 2014-05-06 NOTE — Addendum Note (Signed)
Addended by: Allie BossierVE, Rachna Schonberger C on: 05/06/2014 03:50 PM   Modules accepted: Orders

## 2014-05-06 NOTE — Progress Notes (Signed)
   Subjective:    Patient ID: Kelsey Anthony, female    DOB: 09/15/1981, 33 y.o.   MRN: [id number]  HPI  33 yo MW lady with a h/o IOL for anencephaly. She was seen in the MAU on 05-04-14 with heavy VB. An u/s showed 3cm worth of tissue in the uterus. She was given cytotec. Her bleeding decreased and she had no passage of tissue. She is interested in a d&c. I spoke with Dr. Debroah LoopArnold and he is willing to do her surgery tomorrow am. Her blood type is A+.  Review of Systems     Objective:   Physical Exam        Assessment & Plan:  As above

## 2014-05-07 ENCOUNTER — Ambulatory Visit (HOSPITAL_COMMUNITY): Payer: BC Managed Care – PPO | Admitting: Anesthesiology

## 2014-05-07 ENCOUNTER — Encounter (HOSPITAL_COMMUNITY): Payer: Self-pay | Admitting: Anesthesiology

## 2014-05-07 ENCOUNTER — Encounter (HOSPITAL_COMMUNITY)
Admission: RE | Disposition: A | Discharge status: Home / Self Care | Payer: Self-pay | Source: Ambulatory Visit | Attending: Obstetrics & Gynecology

## 2014-05-07 ENCOUNTER — Encounter (HOSPITAL_COMMUNITY): Payer: BC Managed Care – PPO | Admitting: Anesthesiology

## 2014-05-07 ENCOUNTER — Ambulatory Visit (HOSPITAL_COMMUNITY)
Admission: RE | Admit: 2014-05-07 | Discharge: 2014-05-07 | Disposition: A | Discharge status: Home / Self Care | Payer: BC Managed Care – PPO | Source: Ambulatory Visit | Attending: Obstetrics & Gynecology | Admitting: Obstetrics & Gynecology

## 2014-05-07 DIAGNOSIS — Z880 Allergy status to penicillin: Secondary | ICD-10-CM

## 2014-05-07 DIAGNOSIS — Z79899 Other long term (current) drug therapy: Secondary | ICD-10-CM | POA: Insufficient documentation

## 2014-05-07 DIAGNOSIS — O034 Incomplete spontaneous abortion without complication: Secondary | ICD-10-CM | POA: Diagnosis present

## 2014-05-07 DIAGNOSIS — D649 Anemia, unspecified: Secondary | ICD-10-CM

## 2014-05-07 DIAGNOSIS — E785 Hyperlipidemia, unspecified: Secondary | ICD-10-CM

## 2014-05-07 DIAGNOSIS — O071 Delayed or excessive hemorrhage following failed attempted termination of pregnancy: Secondary | ICD-10-CM

## 2014-05-07 HISTORY — PX: DILATION AND EVACUATION: SHX1459

## 2014-05-07 SURGERY — DILATION AND EVACUATION, UTERUS
Anesthesia: Monitor Anesthesia Care | Site: Uterus

## 2014-05-07 MED ORDER — MIDAZOLAM HCL 2 MG/2ML IJ SOLN
INTRAMUSCULAR | Status: DC | PRN
  Administered 2014-05-07: 2 mg via INTRAVENOUS

## 2014-05-07 MED ORDER — KETOROLAC TROMETHAMINE 30 MG/ML IJ SOLN
INTRAMUSCULAR | Status: DC | PRN
  Administered 2014-05-07: 30 mg via INTRAVENOUS

## 2014-05-07 MED ORDER — LIDOCAINE HCL (CARDIAC) 20 MG/ML IV SOLN
INTRAVENOUS | Status: AC
  Filled 2014-05-07: qty 5

## 2014-05-07 MED ORDER — PROPOFOL 10 MG/ML IV EMUL
INTRAVENOUS | Status: AC
  Filled 2014-05-07: qty 20

## 2014-05-07 MED ORDER — FENTANYL CITRATE 0.05 MG/ML IJ SOLN
INTRAMUSCULAR | Status: DC | PRN
Start: 2014-05-07 — End: 2014-05-07
  Administered 2014-05-07 (×2): 50 ug via INTRAVENOUS

## 2014-05-07 MED ORDER — ONDANSETRON HCL 4 MG/2ML IJ SOLN
INTRAMUSCULAR | Status: DC | PRN
  Administered 2014-05-07: 4 mg via INTRAVENOUS

## 2014-05-07 MED ORDER — LACTATED RINGERS IV SOLN
INTRAVENOUS | Status: DC
  Administered 2014-05-07 (×2): via INTRAVENOUS

## 2014-05-07 MED ORDER — PROPOFOL 10 MG/ML IV BOLUS
INTRAVENOUS | Status: DC | PRN
  Administered 2014-05-07 (×9): 20 mg via INTRAVENOUS

## 2014-05-07 MED ORDER — LIDOCAINE HCL (CARDIAC) 20 MG/ML IV SOLN
INTRAVENOUS | Status: DC | PRN
  Administered 2014-05-07 (×2): 20 mg via INTRAVENOUS

## 2014-05-07 MED ORDER — BUPIVACAINE-EPINEPHRINE (PF) 0.5% -1:200000 IJ SOLN
INTRAMUSCULAR | Status: AC
  Filled 2014-05-07: qty 30

## 2014-05-07 MED ORDER — ONDANSETRON HCL 4 MG/2ML IJ SOLN
INTRAMUSCULAR | Status: AC
  Filled 2014-05-07: qty 2

## 2014-05-07 MED ORDER — FENTANYL CITRATE 0.05 MG/ML IJ SOLN
INTRAMUSCULAR | Status: AC
  Filled 2014-05-07: qty 2

## 2014-05-07 MED ORDER — DOXYCYCLINE HYCLATE 100 MG IV SOLR
200.0000 mg | INTRAVENOUS | Status: AC
  Administered 2014-05-07: 200 mg via INTRAVENOUS
  Filled 2014-05-07: qty 200

## 2014-05-07 MED ORDER — MIDAZOLAM HCL 2 MG/2ML IJ SOLN
INTRAMUSCULAR | Status: AC
  Filled 2014-05-07: qty 2

## 2014-05-07 MED ORDER — KETOROLAC TROMETHAMINE 30 MG/ML IJ SOLN
INTRAMUSCULAR | Status: AC
  Filled 2014-05-07: qty 1

## 2014-05-07 MED ORDER — FENTANYL CITRATE 0.05 MG/ML IJ SOLN: 25.0000 ug | INTRAMUSCULAR | Status: DC | PRN

## 2014-05-07 MED ORDER — METOCLOPRAMIDE HCL 5 MG/ML IJ SOLN: 10.0000 mg | Freq: Once | INTRAMUSCULAR | Status: DC | PRN

## 2014-05-07 MED ORDER — BUPIVACAINE-EPINEPHRINE 0.5% -1:200000 IJ SOLN
INTRAMUSCULAR | Status: DC | PRN
Start: 2014-05-07 — End: 2014-05-07
  Administered 2014-05-07: 12 mL

## 2014-05-07 MED ORDER — KETOROLAC TROMETHAMINE 30 MG/ML IJ SOLN: 15.0000 mg | Freq: Once | INTRAMUSCULAR | Status: DC | PRN

## 2014-05-07 MED ORDER — MEPERIDINE HCL 25 MG/ML IJ SOLN
6.2500 mg | INTRAMUSCULAR | Status: DC | PRN
Start: 2014-05-07 — End: 2014-05-07

## 2014-05-07 SURGICAL SUPPLY — 22 items
CATH ROBINSON RED A/P 16FR (CATHETERS) ×3 IMPLANT
CLOTH BEACON ORANGE TIMEOUT ST (SAFETY) ×3 IMPLANT
DECANTER SPIKE VIAL GLASS SM (MISCELLANEOUS) ×2 IMPLANT
GLOVE BIO SURGEON STRL SZ 6.5 (GLOVE) ×2 IMPLANT
GLOVE BIO SURGEONS STRL SZ 6.5 (GLOVE) ×1
GLOVE BIOGEL PI IND STRL 7.0 (GLOVE) ×1 IMPLANT
GLOVE BIOGEL PI INDICATOR 7.0 (GLOVE) ×2
GOWN STRL REUS W/TWL LRG LVL3 (GOWN DISPOSABLE) ×6 IMPLANT
KIT BERKELEY 1ST TRIMESTER 3/8 (MISCELLANEOUS) IMPLANT
NDL SPNL 22GX3.5 QUINCKE BK (NEEDLE) IMPLANT
NEEDLE SPNL 22GX3.5 QUINCKE BK (NEEDLE) ×3 IMPLANT
NS IRRIG 1000ML POUR BTL (IV SOLUTION) ×3 IMPLANT
PACK VAGINAL MINOR WOMEN LF (CUSTOM PROCEDURE TRAY) ×3 IMPLANT
PAD OB MATERNITY 4.3X12.25 (PERSONAL CARE ITEMS) ×3 IMPLANT
PAD PREP 24X48 CUFFED NSTRL (MISCELLANEOUS) ×3 IMPLANT
SET BERKELEY SUCTION TUBING (SUCTIONS) ×2 IMPLANT
SYR CONTROL 10ML LL (SYRINGE) ×2 IMPLANT
TOWEL OR 17X24 6PK STRL BLUE (TOWEL DISPOSABLE) ×6 IMPLANT
VACURETTE 10 RIGID CVD (CANNULA) ×2 IMPLANT
VACURETTE 7MM CVD STRL WRAP (CANNULA) IMPLANT
VACURETTE 8 RIGID CVD (CANNULA) IMPLANT
VACURETTE 9 RIGID CVD (CANNULA) IMPLANT

## 2014-05-07 NOTE — H&P (Signed)
Kelsey Anthony is an 33 y.o. female. [name] 33 yo MW lady with a h/o IOL for anencephaly. She was seen in the MAU on 05-04-14 with heavy VB. An u/s showed 3cm worth of tissue in the uterus. She was given cytotec. Her bleeding decreased and she had no passage of tissue. She is interested in a d&c. I spoke with Dr. Debroah LoopArnold and he is willing to do her surgery tomorrow am. Her blood type is A+. The procedure and the risk of anesthesia, bleeding, infection, bowel and bladder injury,  were discussed and her questions were answered. The procedure will be scheduled as an outpatient.    Pertinent Gynecological History:  Bleeding: passed clots yesterday Contraception: none DES exposure: denies Blood transfusions: none Sexually transmitted diseases: no past history Previous GYN Procedures: none   Last pap: normal    No LMP recorded. Patient is not currently having periods (Reason: Other).    Past Medical History  Diagnosis Date  . HLD (hyperlipidemia)     borderline  . Ocular migraine 07/2013  . TIA (transient ischemic attack) 7/22 and 730/2014    Past Surgical History  Procedure Laterality Date  . Induction of labor  2015    17 wk anencephaly    Family History  Problem Relation Age of Onset  . Depression Mother   . Mental illness Mother   . Depression Sister   . Heart disease Maternal Grandmother     open heart surgery  . Diabetes Maternal Grandmother   . Cancer Maternal Grandfather     prostate s/p seeds  . Diabetes Paternal Grandmother   . Hypertension Father   . Hyperlipidemia Neg Hx   . Stroke Neg Hx     Social History:  reports that she has never smoked. She has never used smokeless tobacco. She reports that she drinks about .6 ounces of alcohol per week. She reports that she does not use illicit drugs.  Allergies:  Allergies  Allergen Reactions  . Amoxicillin Hives and Rash    Muscle aches  . Penicillins Hives and Rash    Muscle aches     Facility-administered medications prior to admission  Medication Dose Route Frequency Provider Last Rate Last Dose  . doxycycline (VIBRAMYCIN) 100 mg in dextrose 5 % 250 mL IVPB  100 mg Intravenous Once Allie BossierMyra C Dove, MD       Prescriptions prior to admission  Medication Sig Dispense Refill  . acetaminophen (TYLENOL) 500 MG tablet Take 500 mg by mouth every 6 (six) hours as needed for headache.       . folic acid (FOLVITE) 1 MG tablet Take 1 tablet (1 mg total) by mouth daily.  30 tablet  12  . ibuprofen (ADVIL,MOTRIN) 600 MG tablet Take 1 tablet (600 mg total) by mouth every 6 (six) hours as needed.  30 tablet  0  . Multiple Vitamins-Calcium (ONE-A-DAY WOMENS PO) Take 1 tablet by mouth daily.       Marland Kitchen. HYDROcodone-acetaminophen (NORCO/VICODIN) 5-325 MG per tablet Take 1 tablet by mouth every 4 (four) hours as needed.  6 tablet  0    Review of Systems  Constitutional: Negative.   Respiratory: Negative.   Cardiovascular: Negative.     Blood pressure 122/77, pulse 82, temperature 98.1 F (36.7 C), temperature source Oral, resp. rate 20, SpO2 100.00%. Physical Exam  Constitutional: She is oriented to person, place, and time. She appears well-developed.  Respiratory: Effort normal. No respiratory distress.  GI: Soft. She exhibits no mass.  There is no tenderness.  Neurological: She is alert and oriented to person, place, and time.  Skin: Skin is warm and dry.  Psychiatric: She has a normal mood and affect. Her behavior is normal.    No results found for this or any previous visit (from the past 24 hour(s)).  No results found.  Assessment/Plan: Possible retained POC after second trimester loss, suction dilation and curettage   Adam PhenixJames G Arnold 05/07/2014, 8:54 AM

## 2014-05-07 NOTE — Anesthesia Postprocedure Evaluation (Signed)
  Anesthesia Post-op Note  Anesthesia Post Note  Patient: Kelsey Anthony  Procedure(s) Performed: Procedure(s) (LRB): DILATATION AND EVACUATION (N/A)  Anesthesia type: MAC  Patient location: PACU  Post pain: Pain level controlled  Post assessment: Post-op Vital signs reviewed  Last Vitals:  Filed Vitals:   05/07/14 1030  BP: 103/68  Pulse: 70  Temp:   Resp: 13    Post vital signs: Reviewed  Level of consciousness: sedated  Complications: No apparent anesthesia complications

## 2014-05-07 NOTE — Op Note (Signed)
Procedure: Suction dilation and curettage Preoperative diagnosis: Retained products of conception after 17 week termination for fetal anencephaly Postoperative diagnosis: Same Surgeon: Dr. Scheryl DarterJames Andros Channing Assistant: Dr. Hinda Lenisyan Odom Anesthesia: MAC and paracervical block Specimen: Products of conception Estimated blood loss: Negligible Complications: None Counts: Correct  Patient gave written consent for suction dilation and curettage do to vaginal bleeding and an ultrasound that showed probable retained products of conception after a 17 week termination for fetal anencephaly. Patient identification was confirmed and she was brought to the operating room and MAC anesthesia was induced. She was placed in dorsal lithotomy position and perineum and vagina were sterilely prepped and draped. Bladder was drained with her catheter. Exam revealed six-week size uterus no adnexal masses. Speculum was inserted and cervix was visualized. Quarter percent Marcaine with 1 200,000 epinephrine was infiltrated for intracervical block. Cervix was grasped with single-tooth tenaculum. Uterus sounded to 9 cm. Cervix was dilated sufficiently to pass a 10 cm suction curette. Curettage was performed and products of conception were obtained. Complete evacuation uterine cavity was assured. There was minimal bleeding at the end of procedure all instruments removed. She is brought in stable condition to PACU.  Dr. Scheryl DarterJames Safia Panzer May 07 2014 10 AM

## 2014-05-07 NOTE — Anesthesia Preprocedure Evaluation (Signed)
Anesthesia Evaluation  Patient identified by MRN, date of birth, ID band Patient awake    Reviewed: Allergy & Precautions, H&P , NPO status , Patient's Chart, lab work & pertinent test results, reviewed documented beta blocker date and time   History of Anesthesia Complications Negative for: history of anesthetic complications  Airway Mallampati: II TM Distance: >3 FB Neck ROM: full    Dental  (+) Teeth Intact   Pulmonary Recent URI  (bronchitis 3 weeks ago, took antibiotics.  No fever in 2.5 weeks. ), Resolved,  breath sounds clear to auscultation        Cardiovascular negative cardio ROS  Rhythm:regular Rate:Normal     Neuro/Psych  Headaches (ocular migraines - last was in 2014 (originally diagnosed as TIAs)), negative psych ROS   GI/Hepatic negative GI ROS, Neg liver ROS,   Endo/Other  negative endocrine ROS  Renal/GU negative Renal ROS  negative genitourinary   Musculoskeletal   Abdominal   Peds  Hematology  (+) anemia ,   Anesthesia Other Findings   Reproductive/Obstetrics (+) Pregnancy (s/p IOL for anencephaly on 03/27/14, retained products)                           Anesthesia Physical Anesthesia Plan  ASA: II  Anesthesia Plan: MAC   Post-op Pain Management:    Induction:   Airway Management Planned:   Additional Equipment:   Intra-op Plan:   Post-operative Plan:   Informed Consent: I have reviewed the patients History and Physical, chart, labs and discussed the procedure including the risks, benefits and alternatives for the proposed anesthesia with the patient or authorized representative who has indicated his/her understanding and acceptance.     Plan Discussed with: Surgeon and CRNA  Anesthesia Plan Comments:         Anesthesia Quick Evaluation

## 2014-05-07 NOTE — Transfer of Care (Signed)
Immediate Anesthesia Transfer of Care Note  Patient: Kelsey Anthony  Procedure(s) Performed: Procedure(s): DILATATION AND EVACUATION (N/A)  Patient Location: PACU  Anesthesia Type:MAC  Level of Consciousness: awake, alert , oriented and patient cooperative  Airway & Oxygen Therapy: Patient Spontanous Breathing  Post-op Assessment: Report given to PACU RN and Post -op Vital signs reviewed and stable  Post vital signs: Reviewed and stable  Complications: No apparent anesthesia complications

## 2014-05-07 NOTE — Discharge Instructions (Signed)

## 2014-05-08 ENCOUNTER — Encounter (HOSPITAL_COMMUNITY): Payer: Self-pay | Admitting: Obstetrics & Gynecology

## 2014-05-21 ENCOUNTER — Encounter: Payer: Self-pay | Admitting: Obstetrics & Gynecology

## 2014-05-21 ENCOUNTER — Ambulatory Visit (INDEPENDENT_AMBULATORY_CARE_PROVIDER_SITE_OTHER): Payer: BC Managed Care – PPO | Admitting: Obstetrics & Gynecology

## 2014-05-21 VITALS — BP 106/70 | HR 71 | Ht 66.0 in | Wt 122.0 lb

## 2014-05-21 DIAGNOSIS — Z09 Encounter for follow-up examination after completed treatment for conditions other than malignant neoplasm: Secondary | ICD-10-CM

## 2014-05-21 NOTE — Progress Notes (Signed)
   Subjective:    Patient ID: Kelsey Anthony, female    DOB: [birth year], 33 y.o.   MRN: [id number]  HPI  33 yo MW G3P2A1 (4 yo daughter) here today for a 8 week follow up after a d&c for retained POC after IOL at 17 weeks for anencephaly. Her only complaint is pain at the site of her IV in her left hand. She reports that her sadness is gradually improving. She denies the need for antidepressants. She has not had sex since her surgery. She and her husband are not sure what their decision is regarding another pregnancy. She will use condoms prn. She is on folic acid po daily.   Review of Systems     Objective:   Physical Exam  Normal appearing hand/veins      Assessment & Plan:  Post op- doing well RTC prn

## 2014-10-02 ENCOUNTER — Other Ambulatory Visit: Payer: Self-pay

## 2014-10-19 ENCOUNTER — Encounter: Payer: Self-pay | Admitting: Obstetrics & Gynecology

## 2015-11-24 ENCOUNTER — Ambulatory Visit (INDEPENDENT_AMBULATORY_CARE_PROVIDER_SITE_OTHER): Payer: BLUE CROSS/BLUE SHIELD | Admitting: Certified Nurse Midwife

## 2015-11-24 ENCOUNTER — Encounter: Payer: Self-pay | Admitting: Certified Nurse Midwife

## 2015-11-24 VITALS — BP 118/78 | HR 80 | Ht 66.0 in | Wt 127.0 lb

## 2015-11-24 DIAGNOSIS — Z01419 Encounter for gynecological examination (general) (routine) without abnormal findings: Secondary | ICD-10-CM | POA: Diagnosis not present

## 2015-11-24 LAB — TSH: TSH: 2.377 u[IU]/mL (ref 0.350–4.500)

## 2015-11-24 NOTE — Progress Notes (Signed)
Patient ID: Kelsey Anthony, female   DOB: 22-Mar-1981, 34 y.o.   MRN: [id number]    GYNECOLOGY CLINIC ANNUAL PREVENTATIVE CARE ENCOUNTER NOTE  Subjective:   Kelsey Anthony is a 34 y.o. G8P1011 female here for a routine annual gynecologic exam.  Current complaints: none .   Denies abnormal vaginal bleeding, discharge, pelvic pain, problems with intercourse or other gynecologic concerns.    Gynecologic History Patient's last menstrual period was 11/05/2015. Contraception: none Last Pap: 10/14. Results were: normal Last mammogram: na   Obstetric History OB History  Gravida Para Term Preterm AB SAB TAB Ectopic Multiple Living  0 1 0 1 0 0 1    # Outcome Date GA Lbr Len/2nd Weight Sex Delivery Anes PTL Lv  3 Para 03/27/14 [redacted]w[redacted]d 03:01 / 00:04 3 oz (0.085 kg) F Vag-Spont None  FD  2 Term 08/23/05    F Vag-Spont   Y  1 TAB               Past Medical History  Diagnosis Date  . HLD (hyperlipidemia)     borderline  . Ocular migraine 07/2013  . TIA (transient ischemic attack) 7/22 and 730/2014    Past Surgical History  Procedure Laterality Date  . Induction of labor  2015    17 wk anencephaly  . Dilation and evacuation N/A 05/07/2014    Procedure: DILATATION AND EVACUATION;  Surgeon: Adam Phenix, MD;  Location: [facility];  Service: Gynecology;  Laterality: N/A;    Current Outpatient Prescriptions on File Prior to Visit  Medication Sig Dispense Refill  . acetaminophen (TYLENOL) 500 MG tablet Take 500 mg by mouth every 6 (six) hours as needed for headache.     . ibuprofen (ADVIL,MOTRIN) 600 MG tablet Take 1 tablet (600 mg total) by mouth every 6 (six) hours as needed. 30 tablet 0  . Multiple Vitamins-Calcium (ONE-A-DAY WOMENS PO) Take 1 tablet by mouth daily.      No current facility-administered medications on file prior to visit.    Allergies  Allergen Reactions  . Amoxicillin Hives and Rash    Muscle aches  . Penicillins Hives and Rash    Muscle aches     Social History   Social History  . Marital Status: Married    Spouse Name: Judie Grieve  . Number of Children: 1  . Years of Education: 12   Occupational History  .      [employer]   Social History Main Topics  . Smoking status: Never Smoker   . Smokeless tobacco: Never Used  . Alcohol Use: 0.6 oz/week    1 Glasses of wine per week     Comment: Rare/social  . Drug Use: No  . Sexual Activity:    Partners: Male    Birth Control/ Protection: Condom   Other Topics Concern  . Not on file   Social History Narrative   Caffeine: 1 cup soda, tea   Lives with husband and 1 daughter (2006), fish   Occ: Banker   Edu: HS   Activity: softball   Diet: some water, fruits/vegetables daily    Family History  Problem Relation Age of Onset  . Depression Mother   . Mental illness Mother   . Depression Sister   . Heart disease Maternal Grandmother     open heart surgery  . Diabetes Maternal Grandmother   . Cancer Maternal Grandfather     prostate s/p seeds  . Diabetes Paternal Grandmother   .  Hypertension Father   . Hyperlipidemia Neg Hx   . Stroke Neg Hx     The following portions of the patient's history were reviewed and updated as appropriate: allergies, current medications, past family history, past medical history, past social history, past surgical history and problem list.  Review of Systems Pertinent items are noted in HPI.   Objective:  BP 118/78 mmHg  Pulse 80  Ht 5\' 6"  (1.676 m)  Wt 127 lb (57.607 kg)  BMI 20.51 kg/m2  LMP 11/05/2015 CONSTITUTIONAL: Well-developed, well-nourished female in no acute distress.  HENT:  Normocephalic, atraumatic, External right and left ear normal. Oropharynx is clear and moist EYES: Conjunctivae and EOM are normal. Pupils are equal, round, and reactive to light. No scleral icterus.  NECK: Normal range of motion, supple, no masses.  Normal thyroid.  SKIN: Skin is warm and dry. No rash noted. Not diaphoretic. No erythema. No  pallor. NEUROLGIC: Alert and oriented to person, place, and time. Normal reflexes, muscle tone coordination. No cranial nerve deficit noted. PSYCHIATRIC: Normal mood and affect. Normal behavior. Normal judgment and thought content. CARDIOVASCULAR: Normal heart rate noted, regular rhythm RESPIRATORY: Clear to auscultation bilaterally. Effort and breath sounds normal, no problems with respiration noted. BREASTS: Symmetric in size. No masses, skin changes, nipple drainage, or lymphadenopathy. ABDOMEN: Soft, normal bowel sounds, no distention noted.  No tenderness, rebound or guarding.  PELVIC: Normal appearing external genitalia; normal appearing vaginal mucosa and cervix.  No abnormal discharge noted.  Pap smear obtained.  Normal uterine size, no other palpable masses, no uterine or adnexal tenderness. MUSCULOSKELETAL: Normal range of motion. No tenderness.  No cyanosis, clubbing, or edema.  2+ distal pulses.   Assessment:  Annual gynecologic examination with Breast Exam   Plan:   TSH Routine preventative health maintenance measures emphasized. Please refer to After Visit Summary for other counseling recommendations.   Illene BolusLori Angella Montas CNM [facility]

## 2015-11-24 NOTE — Patient Instructions (Signed)
Prenatal Vitamin and Mineral Combinations (oral solid dosage forms) What is this medicine? PRENATAL VITAMIN AND MINERAL combinations are used before, during, and after pregnancy to help provide provide good nutrition. This medicine may be used for other purposes; ask your health care provider or pharmacist if you have questions. What should I tell my health care provider before I take this medicine? They need to know if you have any of these conditions: -bleeding or clotting disorder -history of anemia of any type -other chronic health condition -an unusual or allergic reaction to vitamins, minerals, other medicines, foods, dyes, or preservatives How should I use this medicine? Take this medicine by mouth with a glass of water. You can take it with or without food. If it upsets your stomach, take it with food. Chewable prenatal vitamin tablets may be chewed completely before swallowing. Follow the directions on the prescription label. The usual dose is taken once a day. Do not take your medicine more often than directed. Contact your pediatrician regarding the use of this medicine in children. Special care may be needed. This medicine is intended for females who are pregnant, breast-feeding, or may become pregnant. Overdosage: If you think you have taken too much of this medicine contact a poison control center or emergency room at once. NOTE: This medicine is only for you. Do not share this medicine with others. What if I miss a dose? If you miss a dose, take it as soon as you can. If it is almost time for your next dose, take only that dose. Do not take double or extra doses. What may interact with this medicine? -alendronate -antacids -cefdinir -cefditoren -etidronate -fluoroquinolone antibiotics (examples: ciprofloxacin, gatifloxacin, levofloxacin) -ibandronate -levodopa -risedronate -tetracycline antibiotics (examples: doxycycline, minocycline, tetracycline) -thyroid  hormones -warfarin This list may not describe all possible interactions. Give your health care provider a list of all the medicines, herbs, non-prescription drugs, or dietary supplements you use. Also tell them if you smoke, drink alcohol, or use illegal drugs. Some items may interact with your medicine. What should I watch for while using this medicine? See your health care professional for regular checks on your progress. Remember that vitamin and mineral supplements do not replace the need for good nutrition from a balanced diet. Stools commonly change color when vitamins and minerals are taken. Notify your health care professional if this change is alarming or accompanied by other symptoms, like abdominal pain. What side effects may I notice from receiving this medicine? Side effects that you should report to your doctor or health care professional as soon as possible: -allergic reaction such as skin rash or difficulty breathing -vomiting Side effects that usually do not require medical attention (report to your doctor or health care professional if they continue or are bothersome): -nausea -stomach upset This list may not describe all possible side effects. Call your doctor for medical advice about side effects. You may report side effects to FDA at 1-800-FDA-1088. Where should I keep my medicine? Keep out of the reach of children. Most vitamins and minerals should be stored at controlled room temperature. Check your specific product directions. Protect from heat and moisture. Throw away any unused medicine after the expiration date. NOTE: This sheet is a summary. It may not cover all possible information. If you have questions about this medicine, talk to your doctor, pharmacist, or health care provider.    2016, Elsevier/Gold Standard. (2015-07-01 09:02:38)  

## 2015-11-24 NOTE — Addendum Note (Signed)
Addended by: Tandy GawHINTON, Alesandra Smart C on: 11/24/2015 03:34 PM   Modules accepted: Orders

## 2016-06-27 ENCOUNTER — Ambulatory Visit: Payer: BLUE CROSS/BLUE SHIELD

## 2016-06-29 ENCOUNTER — Encounter: Payer: Self-pay | Admitting: Family Medicine

## 2016-06-29 ENCOUNTER — Ambulatory Visit (INDEPENDENT_AMBULATORY_CARE_PROVIDER_SITE_OTHER): Payer: BLUE CROSS/BLUE SHIELD | Admitting: Family Medicine

## 2016-06-29 VITALS — BP 112/78 | HR 76 | Temp 97.9°F | Wt 125.5 lb

## 2016-06-29 DIAGNOSIS — R194 Change in bowel habit: Secondary | ICD-10-CM | POA: Diagnosis not present

## 2016-06-29 DIAGNOSIS — K921 Melena: Secondary | ICD-10-CM

## 2016-06-29 MED ORDER — METRONIDAZOLE 500 MG PO TABS: 500.0000 mg | ORAL_TABLET | Freq: Three times a day (TID) | ORAL | Status: DC

## 2016-06-29 NOTE — Assessment & Plan Note (Signed)
Concern for antibiotic associated C diff given story. Will send for stool testing, start empiric flagyl after testing received.  Update if not improved with flagyl course - will do further testing to include IBD.  Pt agrees with plan.

## 2016-06-29 NOTE — Progress Notes (Signed)
Pre visit review using our clinic review tool, if applicable. No additional management support is needed unless otherwise documented below in the visit note. 

## 2016-06-29 NOTE — Progress Notes (Signed)
BP 112/78 mmHg  Pulse 76  Temp(Src) 97.9 F (36.6 C) (Oral)  Wt 125 lb 8 oz (56.926 kg)  SpO2 98%  LMP 06/25/2016   CC: bowel concerns  Subjective:    Patient ID: Kelsey Anthony, female    DOB: 16-Jul-1981, 35 y.o.   MRN: [id number]  HPI: Kelsey Anthony is a 35 y.o. female presenting on 06/29/2016 for Bowel Concerns   Last seen 03/2014.   Completed 2 abx courses in last 1.5 months (tooth infection clindamycin, then UTI macrobid). Bowel change since - initially hard stools with constipation, then progressed to mucous over last 2 weeks. Now seeing light/tan mucous to pinkish hues with blood. Occasionally streak of stringy blood. Intermittent diarrhea with slowed stools. ++ stool urgency.   No recent travel. No fevers/chills. No abd pain, nausea/vomiting. Uses well water. No sick contacts at home.   She has increased her yogurt intake recently.   Mother with h/o hepatitis C - she lived in same household. Asks about testing.  Husband just diagnosed with diabetes - she has decreased carb intake.  No recent NSAID use.   LMP 06/25/2016, regular.   Relevant past medical, surgical, family and social history reviewed and updated as indicated. Interim medical history since our last visit reviewed. Allergies and medications reviewed and updated. Current Outpatient Prescriptions on File Prior to Visit  Medication Sig  . acetaminophen (TYLENOL) 500 MG tablet Take 500 mg by mouth every 6 (six) hours as needed for headache.   . ibuprofen (ADVIL,MOTRIN) 600 MG tablet Take 1 tablet (600 mg total) by mouth every 6 (six) hours as needed.  . Multiple Vitamins-Calcium (ONE-A-DAY WOMENS PO) Take 1 tablet by mouth daily.    No current facility-administered medications on file prior to visit.    Review of Systems Per HPI unless specifically indicated in ROS section     Objective:    BP 112/78 mmHg  Pulse 76  Temp(Src) 97.9 F (36.6 C) (Oral)  Wt 125 lb 8 oz (56.926 kg)  SpO2 98%   LMP 06/25/2016  Wt Readings from Last 3 Encounters:  06/29/16 125 lb 8 oz (56.926 kg)  11/24/15 127 lb (57.607 kg)  05/21/14 122 lb (55.339 kg)    Physical Exam  Constitutional: She appears well-developed and well-nourished. No distress.  HENT:  Mouth/Throat: Oropharynx is clear and moist. No oropharyngeal exudate.  Cardiovascular: Normal rate, regular rhythm, normal heart sounds and intact distal pulses.   No murmur heard. Pulmonary/Chest: Effort normal and breath sounds normal. No respiratory distress. She has no wheezes. She has no rales.  Abdominal: Soft. Normal appearance and bowel sounds are normal. She exhibits no distension and no mass. There is no hepatosplenomegaly. There is no tenderness. There is no rigidity, no rebound, no guarding, no CVA tenderness and negative Murphy's sign.  Skin: Skin is warm and dry. No rash noted.  Nursing note and vitals reviewed.  Results for orders placed or performed in visit on 11/24/15  TSH  Result Value Ref Range   TSH 2.377 0.350 - 4.500 uIU/mL      Assessment & Plan:   Problem List Items Addressed This Visit    Bowel habit changes - Primary    Concern for antibiotic associated C diff given story. Will send for stool testing, start empiric flagyl after testing received.  Update if not improved with flagyl course - will do further testing to include IBD.  Pt agrees with plan.      Relevant Orders  Gastrointestinal Pathogen Panel PCR    Other Visit Diagnoses    Blood in stool        Relevant Orders    Gastrointestinal Pathogen Panel PCR        Follow up plan: Return if symptoms worsen or fail to improve.  Eustaquio BoydenJavier Jameir Ake, MD

## 2016-06-29 NOTE — Patient Instructions (Signed)
I do think you have C diff colon infection. Collect stool then start flagyl antibiotic sent to pharmacy. If bowel doesn't return to normal, let me know for further testing.   Clostridium Difficile Infection Clostridium difficile (C. difficile or C. diff) is a bacterium normally found in the intestinal tract or colon. C. difficile infection causes diarrhea and sometimes a severe disease called pseudomembranous colitis (C. difficile colitis). C. difficile colitis can damage the lining of the colon or cause the colon to become very large (toxic megacolon). Older adults and people with certain medical conditions have a greater risk of getting C. difficile infections. CAUSES The balance of bacteria in your colon can change when you are sick, especially when taking antibiotic medicine. Taking antibiotics may allow the C. difficile to grow, multiply, and make a toxin that causes C. difficile infection.  SYMPTOMS  Diarrhea.  Fever.  Fatigue.  Loss of appetite.  Nausea.  Abdominal swelling, pain, or tenderness.  Dehydration. DIAGNOSIS Your health care provider may suspect C. difficile infection based on your symptoms and if you have taken antibiotics recently. Your health care provider may also order:  A lab test that can detect the toxin in your stool.  A sigmoidoscopy or colonoscopy to look at the appearance of your colon. These procedures involve passing an instrument through your rectum to look at the inside of your colon. Your health care provider will help determine if these tests are necessary. TREATMENT Treatment may include:  Taking antibiotics that keep C. difficile from growing.  Stopping the antibiotics you were on before the C. difficile infection began. Only do this if instructed to do so by your health care provider.  IV fluids and correction of electrolyte imbalance.  Surgery to remove the infected part of the intestines. This is rare. HOME CARE INSTRUCTIONS  Drink  enough fluids to keep your urine clear or pale yellow. Avoid milk, caffeine, and alcohol.  Ask your health care provider for specific rehydration instructions.  Eat small, frequent meals rather than large meals.  Take your antibiotics as directed. Finish them even if you start to feel better.  Do not use medicines to slow diarrhea. This could delay healing or cause problems.  Wash your hands thoroughly after using the bathroom and before preparing food. Make sure people who live with you wash their hands often, too.  Clean all surfaces with a product that contains chlorine bleach. SEEK MEDICAL CARE IF:  Your diarrhea lasts longer than expected or comes back after you finish your antibiotic medicine for the C. difficile infection.  You have trouble staying hydrated.  You have a fever. SEEK IMMEDIATE MEDICAL CARE IF:  You have increasing abdominal pain or tenderness.  You have blood in your stools, or your stools look dark black and tarry.  You cannot eat or drink without vomiting.   This information is not intended to replace advice given to you by your health care provider. Make sure you discuss any questions you have with your health care provider.   Document Released: 09/13/2005 Document Revised: 12/25/2014 Document Reviewed: 06/07/2015 Elsevier Interactive Patient Education [employer]2016 Elsevier Inc.

## 2016-07-10 LAB — OTHER SOLSTAS TEST

## 2016-10-21 LAB — GLUCOSE, POCT (MANUAL RESULT ENTRY): POC Glucose: 91 mg/dl (ref 70–99)

## 2016-11-29 ENCOUNTER — Ambulatory Visit: Payer: BC Managed Care – PPO | Admitting: Family Medicine

## 2016-12-19 ENCOUNTER — Ambulatory Visit: Payer: BLUE CROSS/BLUE SHIELD | Admitting: Family Medicine

## 2016-12-27 ENCOUNTER — Encounter: Payer: Self-pay | Admitting: Family Medicine

## 2016-12-27 ENCOUNTER — Ambulatory Visit (INDEPENDENT_AMBULATORY_CARE_PROVIDER_SITE_OTHER): Payer: BLUE CROSS/BLUE SHIELD | Admitting: Family Medicine

## 2016-12-27 VITALS — BP 104/68 | HR 84 | Ht 66.0 in | Wt 120.0 lb

## 2016-12-27 DIAGNOSIS — Z0000 Encounter for general adult medical examination without abnormal findings: Secondary | ICD-10-CM

## 2016-12-27 DIAGNOSIS — Z01419 Encounter for gynecological examination (general) (routine) without abnormal findings: Secondary | ICD-10-CM

## 2016-12-27 DIAGNOSIS — Z87728 Personal history of other specified (corrected) congenital malformations of nervous system and sense organs: Secondary | ICD-10-CM

## 2016-12-27 MED ORDER — FOLIC ACID 1 MG PO TABS: 1.0000 mg | ORAL_TABLET | Freq: Every day | ORAL | 10 refills | Status: DC

## 2016-12-27 NOTE — Patient Instructions (Signed)
Preventive Care 18-39 Years, Female Preventive care refers to lifestyle choices and visits with your health care provider that can promote health and wellness. What does preventive care include?  A yearly physical exam. This is also called an annual well check.  Dental exams once or twice a year.  Routine eye exams. Ask your health care provider how often you should have your eyes checked.  Personal lifestyle choices, including:  Daily care of your teeth and gums.  Regular physical activity.  Eating a healthy diet.  Avoiding tobacco and drug use.  Limiting alcohol use.  Practicing safe sex.  Taking vitamin and mineral supplements as recommended by your health care provider. What happens during an annual well check? The services and screenings done by your health care provider during your annual well check will depend on your age, overall health, lifestyle risk factors, and family history of disease. Counseling  Your health care provider may ask you questions about your:  Alcohol use.  Tobacco use.  Drug use.  Emotional well-being.  Home and relationship well-being.  Sexual activity.  Eating habits.  Work and work environment.  Method of birth control.  Menstrual cycle.  Pregnancy history. Screening  You may have the following tests or measurements:  Height, weight, and BMI.  Diabetes screening. This is done by checking your blood sugar (glucose) after you have not eaten for a while (fasting).  Blood pressure.  Lipid and cholesterol levels. These may be checked every 5 years starting at age 20.  Skin check.  Hepatitis C blood test.  Hepatitis B blood test.  Sexually transmitted disease (STD) testing.  BRCA-related cancer screening. This may be done if you have a family history of breast, ovarian, tubal, or peritoneal cancers.  Pelvic exam and Pap test. This may be done every 3 years starting at age 21. Starting at age 30, this may be done every 5  years if you have a Pap test in combination with an HPV test. Discuss your test results, treatment options, and if necessary, the need for more tests with your health care provider. Vaccines  Your health care provider may recommend certain vaccines, such as:  Influenza vaccine. This is recommended every year.  Tetanus, diphtheria, and acellular pertussis (Tdap, Td) vaccine. You may need a Td booster every 10 years.  Varicella vaccine. You may need this if you have not been vaccinated.  HPV vaccine. If you are 26 or younger, you may need three doses over 6 months.  Measles, mumps, and rubella (MMR) vaccine. You may need at least one dose of MMR. You may also need a second dose.  Pneumococcal 13-valent conjugate (PCV13) vaccine. You may need this if you have certain conditions and were not previously vaccinated.  Pneumococcal polysaccharide (PPSV23) vaccine. You may need one or two doses if you smoke cigarettes or if you have certain conditions.  Meningococcal vaccine. One dose is recommended if you are age 19-21 years and a first-year college student living in a residence hall, or if you have one of several medical conditions. You may also need additional booster doses.  Hepatitis A vaccine. You may need this if you have certain conditions or if you travel or work in places where you may be exposed to hepatitis A.  Hepatitis B vaccine. You may need this if you have certain conditions or if you travel or work in places where you may be exposed to hepatitis B.  Haemophilus influenzae type b (Hib) vaccine. You may need this   if you have certain risk factors. Talk to your health care provider about which screenings and vaccines you need and how often you need them. This information is not intended to replace advice given to you by your health care provider. Make sure you discuss any questions you have with your health care provider. Document Released: 01/30/2002 Document Revised: 08/23/2016  Document Reviewed: 10/05/2015 Elsevier Interactive Patient Education  2017 [employer].

## 2016-12-27 NOTE — Progress Notes (Signed)
   CLINIC ENCOUNTER NOTE  History:  36 y.o. [id number] here today for annual exam. No complaints today  BCM- condoms Menses: regular Pap- in Oct 2014, NIL with negative HPV  She denies any abnormal vaginal discharge, bleeding, pelvic pain or other concerns.   Past Medical History:  Diagnosis Date  . HLD (hyperlipidemia)    borderline  . Ocular migraine 07/2013  . TIA (transient ischemic attack) 7/22 and 730/2014    Past Surgical History:  Procedure Laterality Date  . DILATION AND EVACUATION N/A 05/07/2014   Procedure: DILATATION AND EVACUATION;  Surgeon: Adam PhenixJames G Arnold, MD;  Location: [facility];  Service: Gynecology;  Laterality: N/A;  . induction of labor  2015   17 wk anencephaly    The following portions of the patient's history were reviewed and updated as appropriate: allergies, current medications, past family history, past medical history, past social history, past surgical history and problem list.   Health Maintenance:  Normal pap and negative HRHPV on 09/24/2013 (NIL with HPV neg @age  32). Mammogram@50    Review of Systems:  Pertinent items noted in HPI and remainder of comprehensive ROS otherwise negative.   Objective:  Physical Exam BP 104/68   Pulse 84   Ht 5\' 6"  (1.676 m)   Wt 120 lb (54.4 kg)   LMP 12/12/2016   BMI 19.37 kg/m  CONSTITUTIONAL: Well-developed, well-nourished female in no acute distress.  HENT:  Normocephalic, atraumatic. External right and left ear normal. Oropharynx is clear and moist EYES: Conjunctivae and EOM are normal. Pupils are equal, round, and reactive to light. No scleral icterus.  NECK: Normal range of motion, supple, no masses SKIN: Skin is warm and dry. No rash noted. Not diaphoretic. No erythema. No pallor. NEUROLGIC: Alert and oriented to person, place, and time. Normal reflexes, muscle tone coordination. No cranial nerve deficit noted. PSYCHIATRIC: Normal mood and affect. Normal behavior. Normal judgment and thought  content. CARDIOVASCULAR: Normal heart rate noted RESPIRATORY: Effort and breath sounds normal, no problems with respiration noted ABDOMEN: Soft, no distention noted.  BREAST: No abnormalities note. No nipple retraction/discharge. No masses palpated bilaterally PELVIC: Normal appearing external genitalia; normal appearing vaginal mucosa and cervix.  No abnormal discharge noted.  Normal uterine size, no other palpable masses, no uterine or adnexal tenderness. MUSCULOSKELETAL: Normal range of motion. No edema noted.  Labs and Imaging No results found.  Assessment & Plan:  1. Well woman exam (no gynecological exam) - No need for pap today - Reviewed health diet and weight  - Reviewed preventative screenings  - Does not desire more effective BCM at this time. Her partner might have a vasectomy  2. History of neural tube defect - Rx for folic acid given to patient as she unsure about future pregnancy and had history acephaly in last pregnancy.    Routine preventative health maintenance measures emphasized. Please refer to After Visit Summary for other counseling recommendations.   Return in about 1 year (around 12/27/2017) for Yearly wellness exam.

## 2016-12-27 NOTE — Progress Notes (Signed)
Last Pap 2014 - Normal

## 2016-12-31 ENCOUNTER — Encounter: Payer: Self-pay | Admitting: Family Medicine

## 2017-11-15 ENCOUNTER — Encounter: Payer: Self-pay | Admitting: Radiology

## 2017-12-25 ENCOUNTER — Telehealth: Payer: Self-pay | Admitting: Radiology

## 2017-12-25 NOTE — Telephone Encounter (Signed)
Patient sent message through Mychart requesting Annual Exam with Dr Alvester MorinNewton. Scheduled appointment for 01/30/18 @ 4:15 and notified patient on cell phone voicemail.

## 2018-01-30 ENCOUNTER — Ambulatory Visit (INDEPENDENT_AMBULATORY_CARE_PROVIDER_SITE_OTHER): Payer: BLUE CROSS/BLUE SHIELD | Admitting: Family Medicine

## 2018-01-30 ENCOUNTER — Encounter: Payer: Self-pay | Admitting: Family Medicine

## 2018-01-30 VITALS — BP 134/83 | HR 72 | Wt 119.2 lb

## 2018-01-30 DIAGNOSIS — Z1151 Encounter for screening for human papillomavirus (HPV): Secondary | ICD-10-CM | POA: Diagnosis not present

## 2018-01-30 DIAGNOSIS — Z01419 Encounter for gynecological examination (general) (routine) without abnormal findings: Secondary | ICD-10-CM | POA: Diagnosis not present

## 2018-01-30 DIAGNOSIS — Z124 Encounter for screening for malignant neoplasm of cervix: Secondary | ICD-10-CM

## 2018-01-30 NOTE — Progress Notes (Signed)
Lat pap 2014

## 2018-01-30 NOTE — Progress Notes (Signed)
   GYNECOLOGY ANNUAL PREVENTATIVE CARE ENCOUNTER NOTE  Subjective:   Kelsey Anthony is a 37 y.o. 683P1011 female here for a routine annual gynecologic exam.  Current complaints: none.  Denies abnormal vaginal bleeding, discharge, pelvic pain, problems with intercourse or other gynecologic concerns.    Gynecologic History Patient's last menstrual period was 01/01/2018 (exact date).  LMP 01/01/2018 Contraception: condoms- no using all the time. - had uprotected sex 5 times.  Last Pap: 2014. Results were: normal Last mammogram: none.   The following portions of the patient's history were reviewed and updated as appropriate: allergies, current medications, past family history, past medical history, past social history, past surgical history and problem list.  Review of Systems Pertinent items are noted in HPI.   Objective:  BP 134/83   Pulse 72   Wt 119 lb 3.2 oz (54.1 kg)   LMP 01/01/2018 (Exact Date)   BMI 19.24 kg/m  CONSTITUTIONAL: Well-developed, well-nourished female in no acute distress.  HENT:  Normocephalic, atraumatic, External right and left ear normal. Oropharynx is clear and moist EYES:  No scleral icterus.  NECK: Normal range of motion, supple, no masses.  Normal thyroid.  SKIN: Skin is warm and dry. No rash noted. Not diaphoretic. No erythema. No pallor. NEUROLOGIC: Alert and oriented to person, place, and time. Normal reflexes, muscle tone coordination. No cranial nerve deficit noted. PSYCHIATRIC: Normal mood and affect. Normal behavior. Normal judgment and thought content. CARDIOVASCULAR: Normal heart rate noted, regular rhythm. 2+ distal pulses. RESPIRATORY: Effort and breath sounds normal, no problems with respiration noted. BREASTS: Symmetric in size. No masses, skin changes, nipple drainage, or lymphadenopathy. ABDOMEN: Soft,  no distention noted.  No tenderness, rebound or guarding.  PELVIC: Normal appearing external genitalia; normal appearing vaginal mucosa  and cervix.  No abnormal discharge noted.  Pap smear obtained.  Normal uterine size, no other palpable masses, no uterine or adnexal tenderness. MUSCULOSKELETAL: Normal range of motion.    Assessment and Plan:  1) Annual gynecologic examination with pap smear:  Will follow up results of pap smear and manage accordingly. Declined STI.  Routine preventative health maintenance measures emphasized.  2) Contraception counseling: Reviewed all forms of birth control options available including abstinence; over the counter/barrier methods; hormonal contraceptive medication including pill, patch, ring, injection,contraceptive implant; hormonal and nonhormonal IUDs; permanent sterilization options including vasectomy and the various tubal sterilization modalities. Risks and benefits reviewed.  Questions were answered.  Written information was also given to the patient to review.  Patient desires nothing for contraception.  Emphasized use of condoms 100% of the time for STI prevention.  Please refer to After Visit Summary for other counseling recommendations.   Return in about 1 year (around 01/30/2019) for Yearly wellness exam.   Federico FlakeKimberly Niles Margan Elias, MD, MPH, ABFM Attending Physician Faculty Practice- Center [facility]

## 2018-02-03 LAB — CYTOLOGY - PAP
Diagnosis: NEGATIVE
HPV: NOT DETECTED

## 2018-12-02 ENCOUNTER — Encounter: Payer: Self-pay | Admitting: Radiology

## 2021-09-07 ENCOUNTER — Other Ambulatory Visit: Payer: Self-pay | Admitting: Family Medicine

## 2021-09-07 ENCOUNTER — Ambulatory Visit (INDEPENDENT_AMBULATORY_CARE_PROVIDER_SITE_OTHER): Payer: BC Managed Care – PPO | Admitting: Family Medicine

## 2021-09-07 ENCOUNTER — Other Ambulatory Visit (HOSPITAL_COMMUNITY)
Admission: RE | Admit: 2021-09-07 | Discharge: 2021-09-07 | Disposition: A | Discharge status: Home / Self Care | Payer: BC Managed Care – PPO | Source: Ambulatory Visit | Attending: Family Medicine | Admitting: Family Medicine

## 2021-09-07 ENCOUNTER — Other Ambulatory Visit: Payer: Self-pay

## 2021-09-07 ENCOUNTER — Encounter: Payer: Self-pay | Admitting: Family Medicine

## 2021-09-07 VITALS — BP 113/78 | HR 87 | Ht 66.0 in | Wt 118.0 lb

## 2021-09-07 DIAGNOSIS — Z1231 Encounter for screening mammogram for malignant neoplasm of breast: Secondary | ICD-10-CM

## 2021-09-07 DIAGNOSIS — Z124 Encounter for screening for malignant neoplasm of cervix: Secondary | ICD-10-CM

## 2021-09-07 DIAGNOSIS — Z01419 Encounter for gynecological examination (general) (routine) without abnormal findings: Secondary | ICD-10-CM

## 2021-09-07 NOTE — Progress Notes (Signed)
   GYNECOLOGY ANNUAL PREVENTATIVE CARE ENCOUNTER NOTE  Subjective:   Kelsey Anthony is a 40 y.o. G31P1011 female here for a routine annual gynecologic exam.  Current complaints: none.   Denies abnormal vaginal bleeding, discharge, pelvic pain, problems with intercourse or other gynecologic concerns.    Gynecologic History Patient's last menstrual period was 08/30/2021 (exact date). Contraception: none Last Pap: 2019. Results were: normal Last mammogram: needs- ordered.   Health Maintenance Due  Topic Date Due   COVID-19 Vaccine (1) Never done   Hepatitis C Screening  Never done   TETANUS/TDAP  Never done   INFLUENZA VACCINE  Never done    The following portions of the patient's history were reviewed and updated as appropriate: allergies, current medications, past family history, past medical history, past social history, past surgical history and problem list.  Review of Systems Pertinent items are noted in HPI.   Objective:  BP 113/78   Pulse 87   Ht 5\' 6"  (1.676 m)   Wt 118 lb (53.5 kg)   LMP 08/30/2021 (Exact Date)   BMI 19.05 kg/m  CONSTITUTIONAL: Well-developed, well-nourished female in no acute distress.  HENT:  Normocephalic, atraumatic, External right and left ear normal. Oropharynx is clear and moist EYES:  No scleral icterus.  NECK: Normal range of motion, supple, no masses.  Normal thyroid.  SKIN: Skin is warm and dry. No rash noted. Not diaphoretic. No erythema. No pallor. NEUROLOGIC: Alert and oriented to person, place, and time. Normal reflexes, muscle tone coordination. No cranial nerve deficit noted. PSYCHIATRIC: Normal mood and affect. Normal behavior. Normal judgment and thought content. CARDIOVASCULAR: Normal heart rate noted, regular rhythm. 2+ distal pulses. RESPIRATORY: Effort and breath sounds normal, no problems with respiration noted. BREASTS: Symmetric in size. No masses, skin changes, nipple drainage, or lymphadenopathy. ABDOMEN: Soft,  no  distention noted.  No tenderness, rebound or guarding.  PELVIC: Normal appearing external genitalia; normal appearing vaginal mucosa and cervix.  No abnormal discharge noted.  Pap smear obtained.  Normal uterine size, no other palpable masses, no uterine or adnexal tenderness. MUSCULOSKELETAL: Normal range of motion.    Assessment and Plan:  1) Annual gynecologic examination with pap smear:  Will follow up results of pap smear and manage accordingly. Declined STI screen .  Routine preventative health maintenance measures emphasized.  1. Screening for cervical cancer - Cytology - PAP  2. Well woman exam with routine gynecological exam - MM DIGITAL SCREENING BILATERAL; Future  3. Encounter for screening mammogram for malignant neoplasm of breast - MM DIGITAL SCREENING BILATERAL; Future  Please refer to After Visit Summary for other counseling recommendations.   Return in about 1 year (around 09/07/2022) for Yearly wellness exam.  09/09/2022, MD, MPH, ABFM Attending Physician Center for [facility]

## 2021-09-07 NOTE — Progress Notes (Signed)
Patient presents for Annual Exam.  LMP: 08/30/21 Monthly light flow Last pap:01/30/2018 Contraception: None  STD Screening:None  Family Hx of Breast Cancer: None  Mammogram: Not yet   CC: None

## 2021-09-13 LAB — CYTOLOGY - PAP
Comment: NEGATIVE
Diagnosis: UNDETERMINED — AB
High risk HPV: NEGATIVE

## 2021-10-14 ENCOUNTER — Other Ambulatory Visit: Payer: Self-pay

## 2021-10-14 ENCOUNTER — Ambulatory Visit
Admission: RE | Admit: 2021-10-14 | Discharge: 2021-10-14 | Disposition: A | Discharge status: Home / Self Care | Payer: BC Managed Care – PPO | Source: Ambulatory Visit | Attending: Family Medicine | Admitting: Family Medicine

## 2021-10-14 DIAGNOSIS — Z1231 Encounter for screening mammogram for malignant neoplasm of breast: Secondary | ICD-10-CM | POA: Diagnosis not present

## 2021-10-18 ENCOUNTER — Other Ambulatory Visit: Payer: Self-pay | Admitting: Family Medicine

## 2021-10-18 DIAGNOSIS — R928 Other abnormal and inconclusive findings on diagnostic imaging of breast: Secondary | ICD-10-CM

## 2021-10-26 ENCOUNTER — Other Ambulatory Visit: Payer: Self-pay | Admitting: Family Medicine

## 2021-10-26 ENCOUNTER — Ambulatory Visit
Admission: RE | Admit: 2021-10-26 | Discharge: 2021-10-26 | Disposition: A | Discharge status: Home / Self Care | Payer: BC Managed Care – PPO | Source: Ambulatory Visit | Attending: Family Medicine | Admitting: Family Medicine

## 2021-10-26 ENCOUNTER — Other Ambulatory Visit: Payer: Self-pay

## 2021-10-26 DIAGNOSIS — R928 Other abnormal and inconclusive findings on diagnostic imaging of breast: Secondary | ICD-10-CM

## 2021-10-26 DIAGNOSIS — N6324 Unspecified lump in the left breast, lower inner quadrant: Secondary | ICD-10-CM | POA: Diagnosis not present

## 2021-10-26 DIAGNOSIS — N6323 Unspecified lump in the left breast, lower outer quadrant: Secondary | ICD-10-CM | POA: Diagnosis not present

## 2021-11-03 ENCOUNTER — Other Ambulatory Visit: Payer: BC Managed Care – PPO

## 2021-11-09 ENCOUNTER — Other Ambulatory Visit: Payer: BC Managed Care – PPO

## 2022-01-26 IMAGING — MG MM DIGITAL SCREENING BILAT W/ TOMO AND CAD
8 series · 9 of 24 positions shown · non-contrast
Comparison: None.

CLINICAL DATA: Screening.

EXAM:
DIGITAL SCREENING BILATERAL MAMMOGRAM WITH TOMOSYNTHESIS AND CAD
TECHNIQUE: Bilateral screening digital craniocaudal and mediolateral oblique
mammograms were obtained. Bilateral screening digital breast
tomosynthesis was performed. The images were evaluated with
computer-aided detection.

[L CC synth-2D]
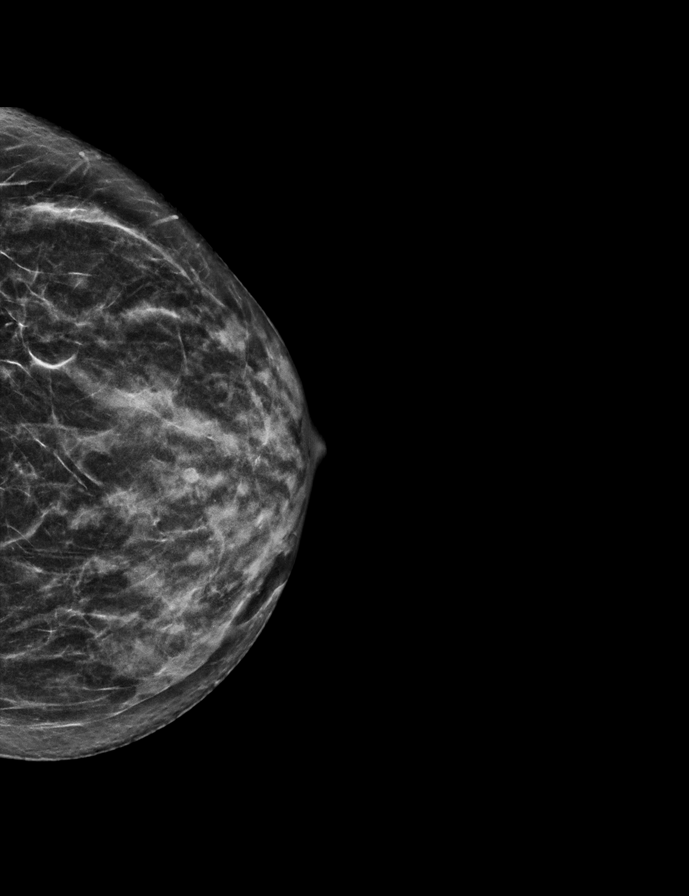

[L MLO synth-2D]
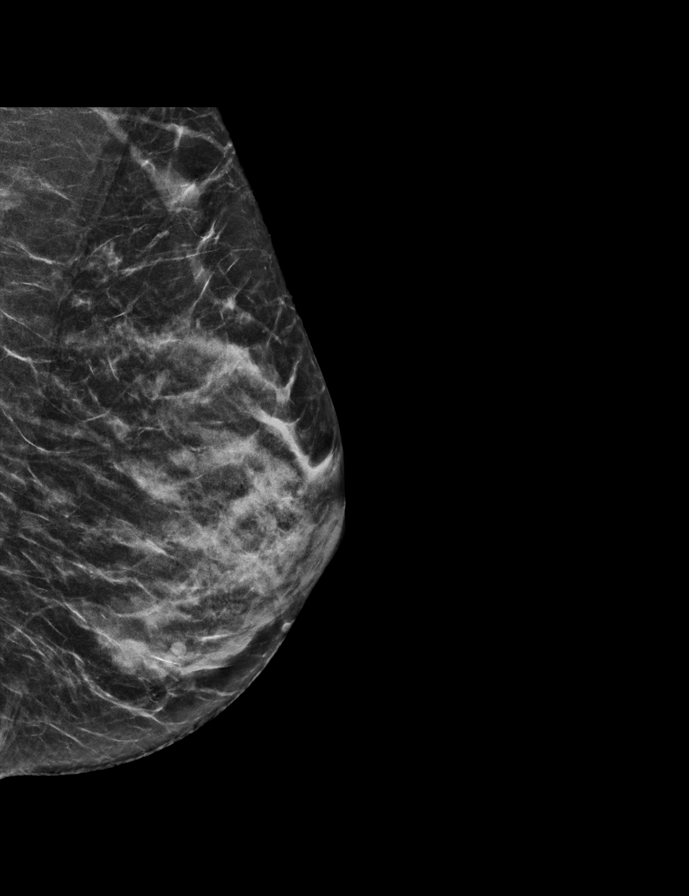

[R CC synth-2D]
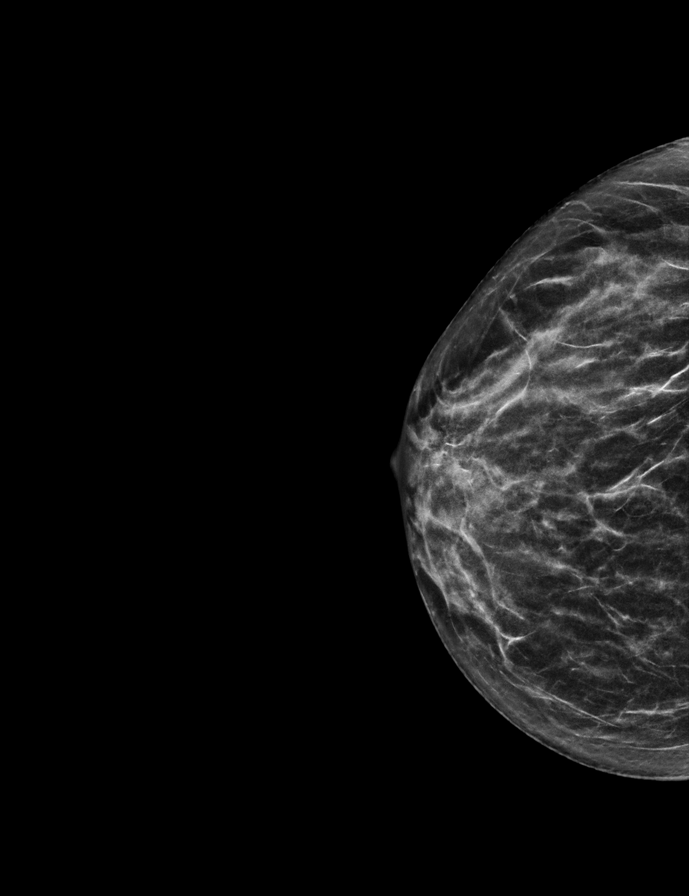

[R MLO synth-2D]
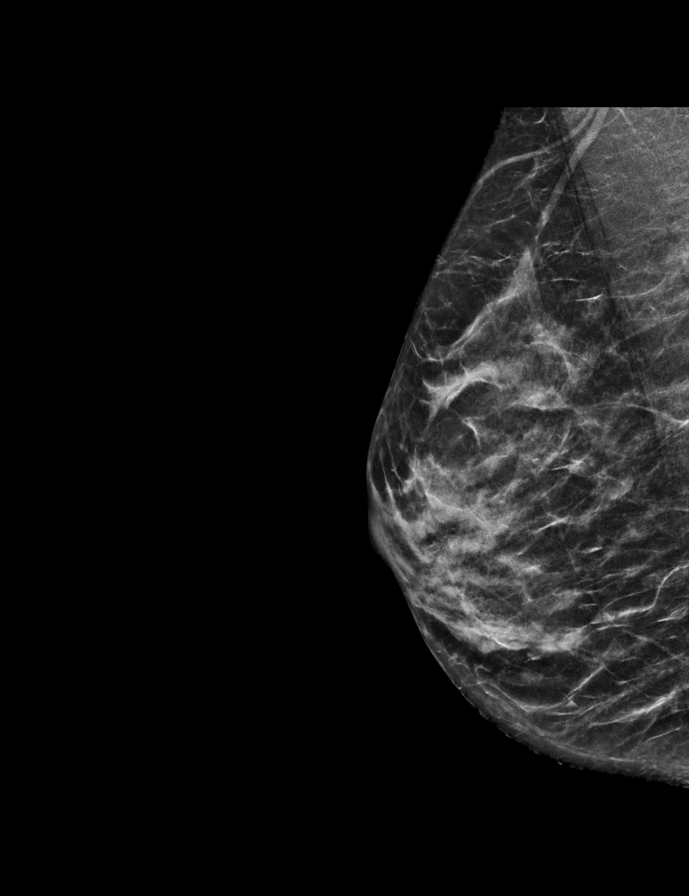

[R CC tomo · 2 of 46 frames shown]
[frame 15/46]
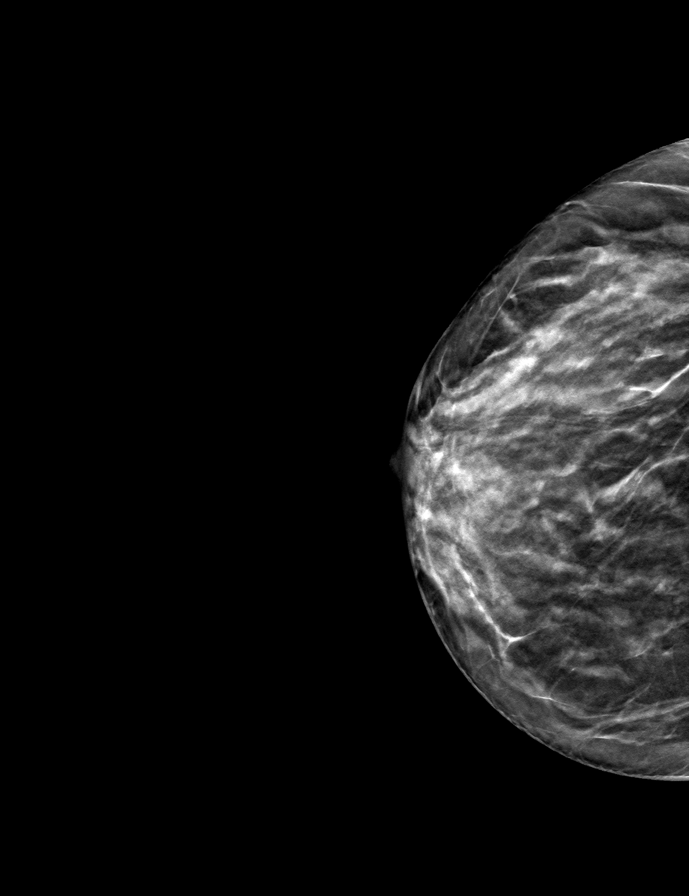
[frame 23/46]
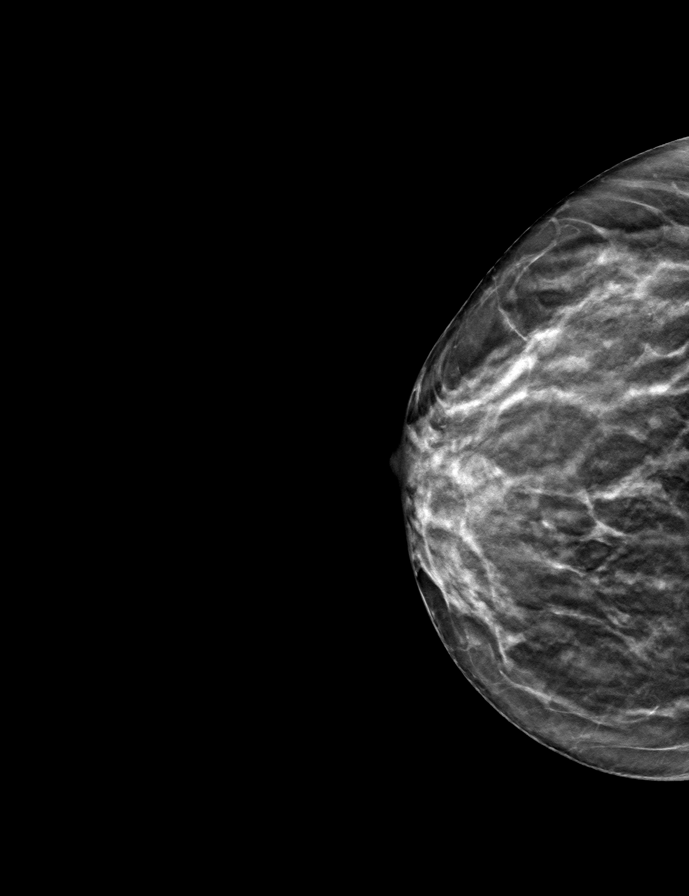

[L MLO tomo · tomo slice 24/47.0]
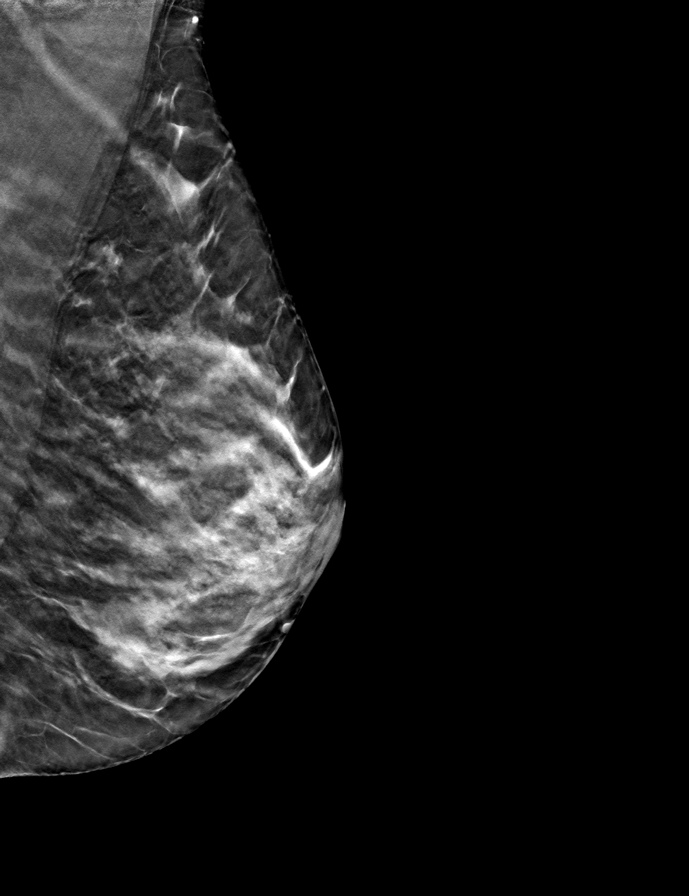

[L CC tomo · tomo slice 23/46.0]
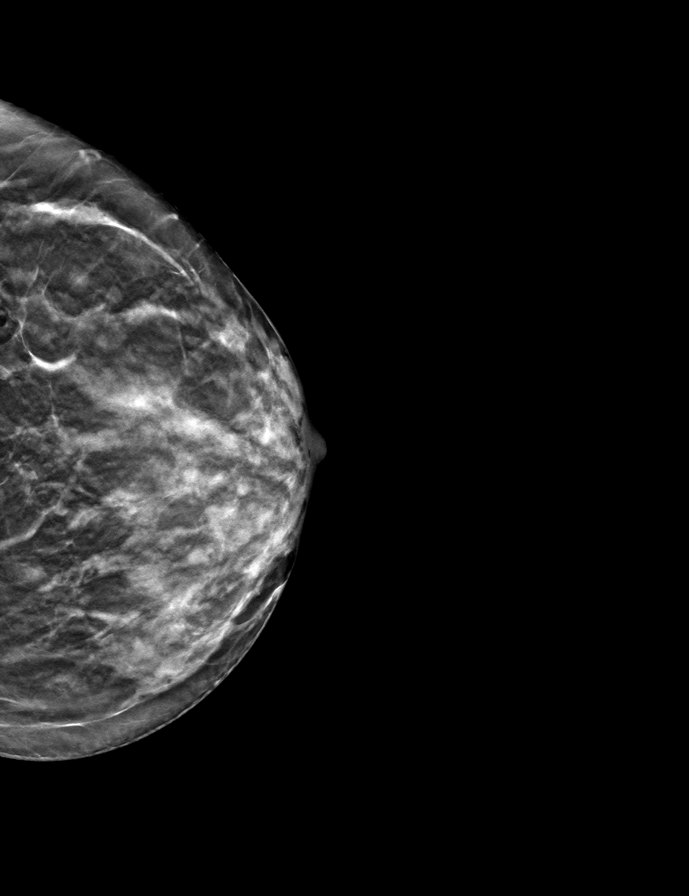

[R MLO tomo · tomo slice 25/49.0]
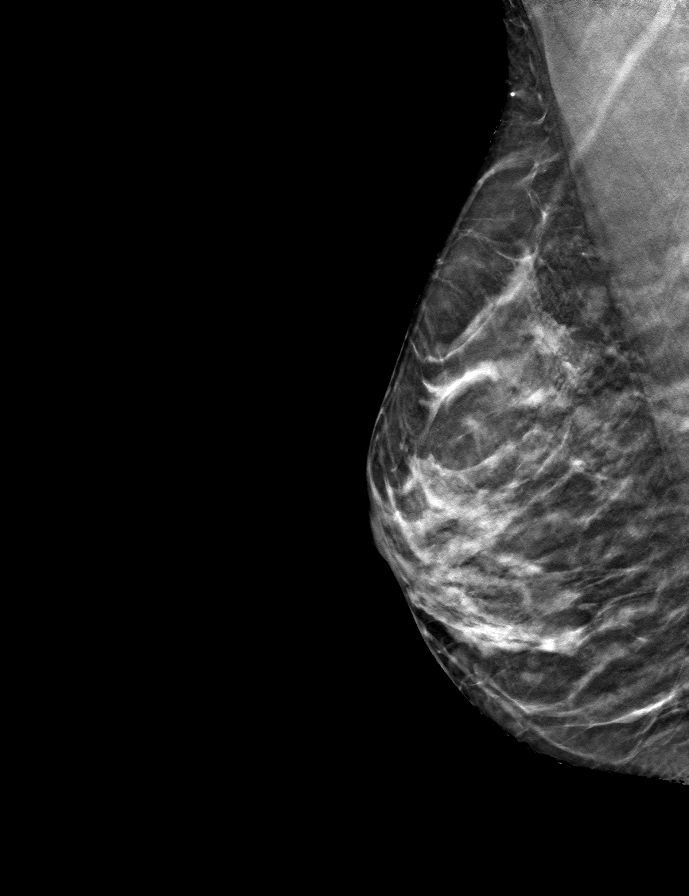

[9 of 24 positions shown; findings below may reference images not displayed]

ACR Breast Density Category c: The breast tissue is heterogeneously
dense, which may obscure small masses.
FINDINGS: In the left breast, a possible mass warrants further evaluation. In
the right breast, no findings suspicious for malignancy.
IMPRESSION: Further evaluation is suggested for possible mass in the left
breast.

RECOMMENDATION:
Ultrasound of the left breast. (Code:[id number])

The patient will be contacted regarding the findings, and additional
imaging will be scheduled.

BI-RADS CATEGORY  0: Incomplete. Need additional imaging evaluation
and/or prior mammograms for comparison.

## 2022-02-07 IMAGING — US US BREAST*L* LIMITED INC AXILLA
1 series · 5 of 5 positions shown · non-contrast
Comparison: Previous exam(s).

CLINICAL DATA: 40-year-old female for further evaluation of LEFT
breast mass identified on baseline screening mammogram.

EXAM:
ULTRASOUND OF THE LEFT BREAST

[Series 1: us breast*left* limited inc axilla · 0.03mm/px · 5 of 5 slices shown]
[im 1/5]
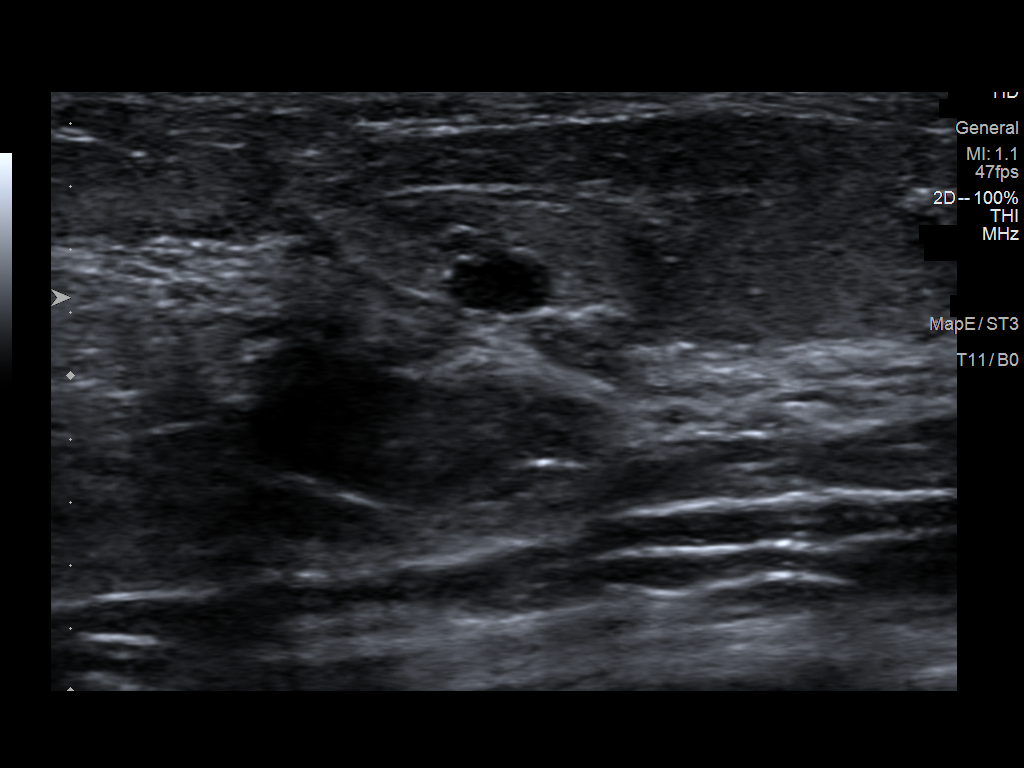
[im 2/5]
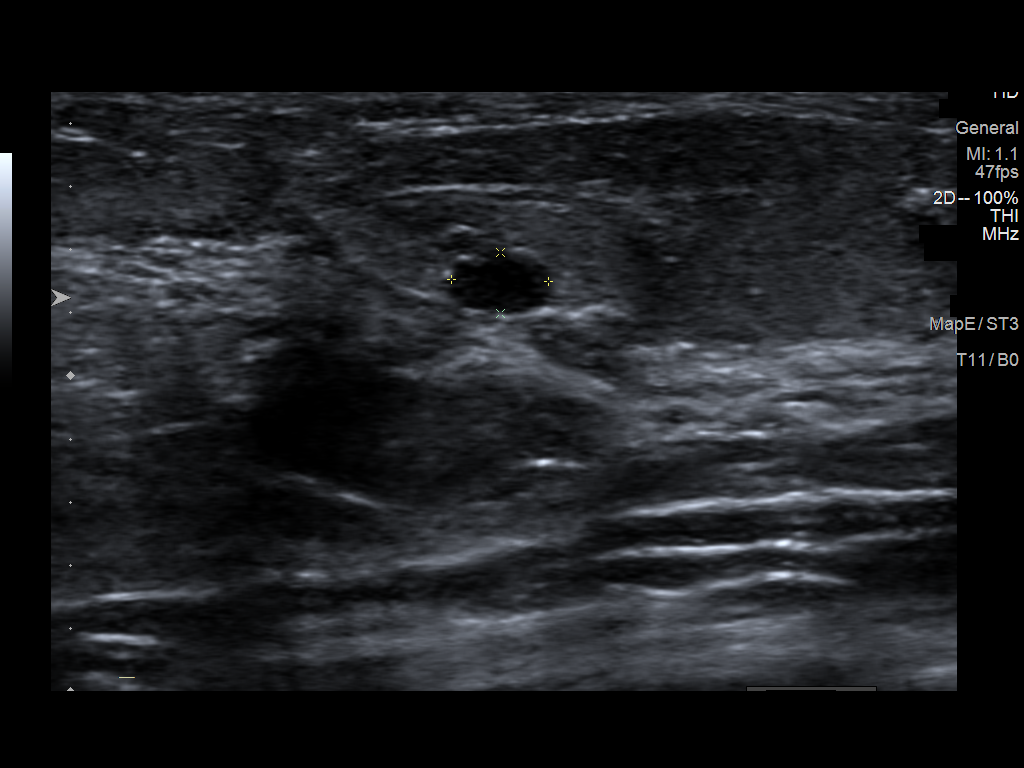
[im 3/5]
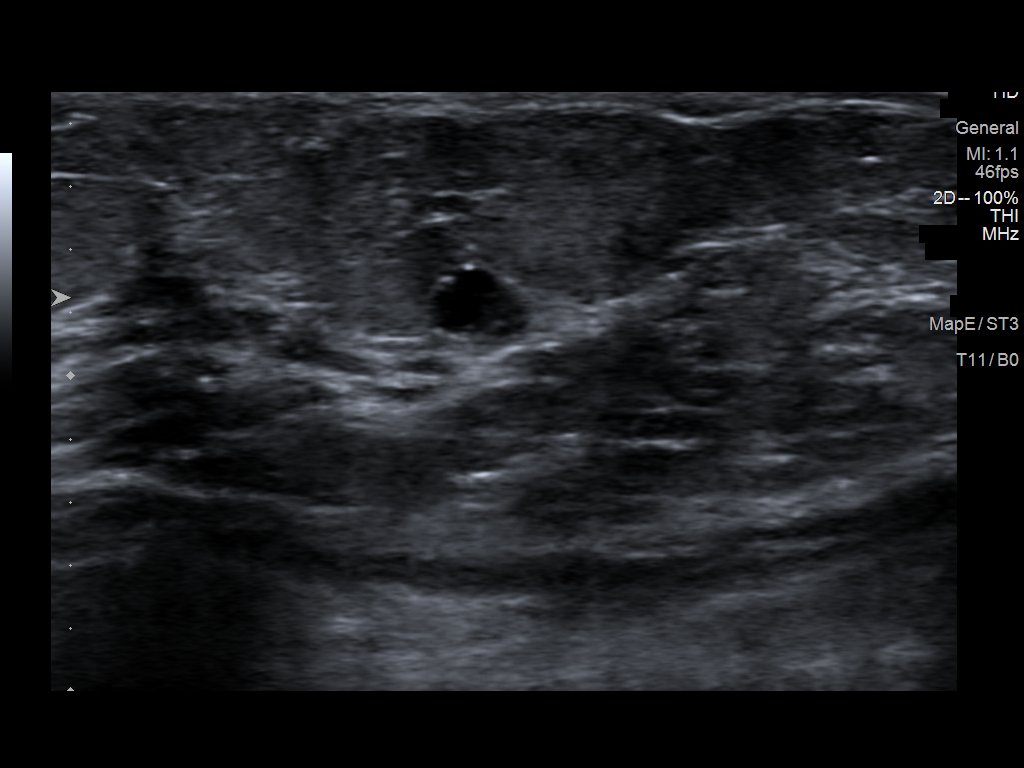
[im 4/5]
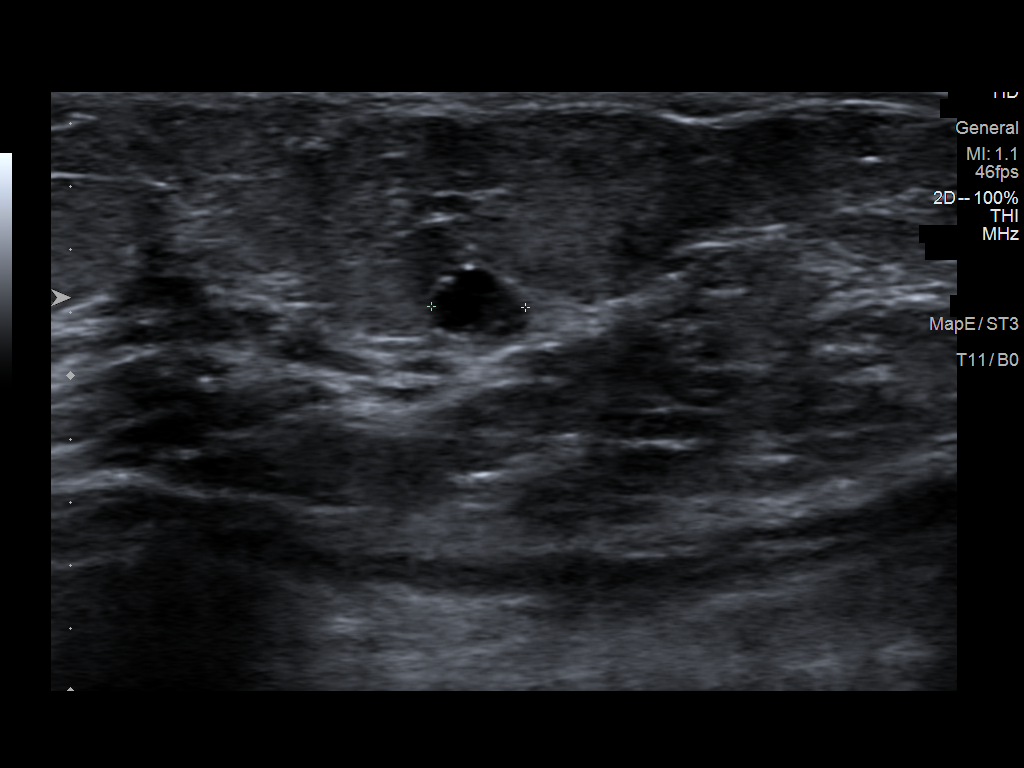
[im 5/5]
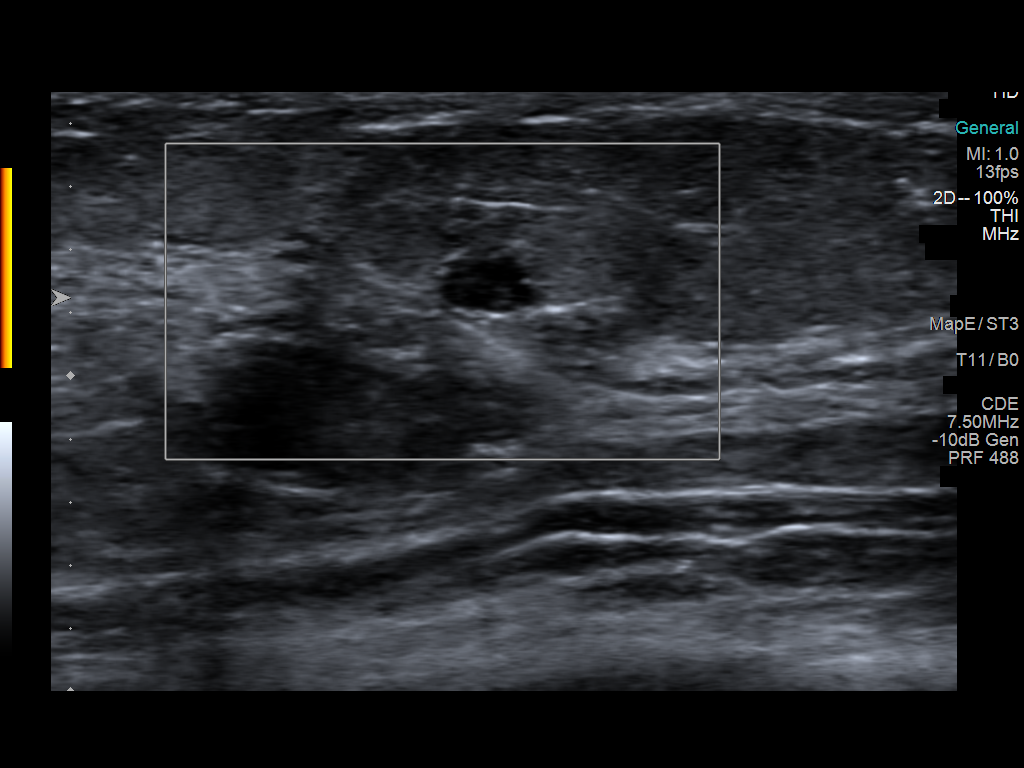

[5 of 5 positions shown; findings below may reference images not displayed]

FINDINGS: Targeted ultrasound is performed, showing a 0.3 x 0.2 x 0.3 cm near
anechoic circumscribed oval mass at the 6 o'clock position of the
LEFT breast 2 cm from the nipple, likely representing a complicated
cyst and a good correlate to the screening study finding. No
internal vascular flow is identified. No other sonographic
abnormalities in the LOWER LEFT breast identified.
IMPRESSION: Likely benign mass/probable complicated cyst in the LOWER LEFT
breast. Six-month follow-up is recommended to ensure stability.

RECOMMENDATION:
LEFT breast ultrasound in 6 months.

I have discussed the findings and recommendations with the patient.
If applicable, a reminder letter will be sent to the patient
regarding the next appointment.

BI-RADS CATEGORY  3: Probably benign.

## 2022-04-26 ENCOUNTER — Other Ambulatory Visit: Payer: BC Managed Care – PPO

## 2022-05-02 ENCOUNTER — Ambulatory Visit
Admission: RE | Admit: 2022-05-02 | Discharge: 2022-05-02 | Disposition: A | Discharge status: Home / Self Care | Payer: BC Managed Care – PPO | Source: Ambulatory Visit | Attending: Family Medicine | Admitting: Family Medicine

## 2022-05-02 DIAGNOSIS — R928 Other abnormal and inconclusive findings on diagnostic imaging of breast: Secondary | ICD-10-CM

## 2022-05-02 DIAGNOSIS — N6002 Solitary cyst of left breast: Secondary | ICD-10-CM | POA: Diagnosis not present

## 2022-08-14 IMAGING — US US BREAST*L* LIMITED INC AXILLA
1 series · 7 of 7 positions shown · non-contrast
Comparison: Previous exams including LEFT breast ultrasound dated
10/26/2021.

CLINICAL DATA: Follow-up for probably benign mass in the LEFT
breast. This probably benign finding was initially identified on
baseline screening mammogram dated 10/14/2021.

EXAM:
ULTRASOUND OF THE LEFT BREAST

[Series 1: us breast*left* limited inc axilla · 0.03mm/px · 7 of 7 slices shown]
[im 1/7]
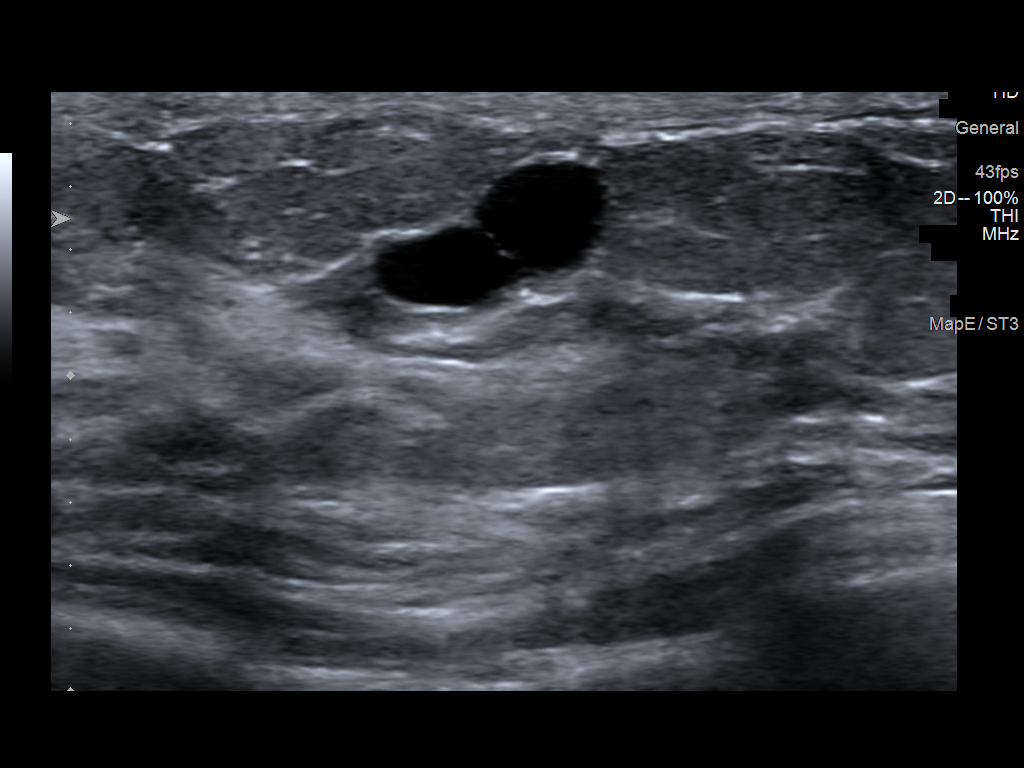
[im 2/7]
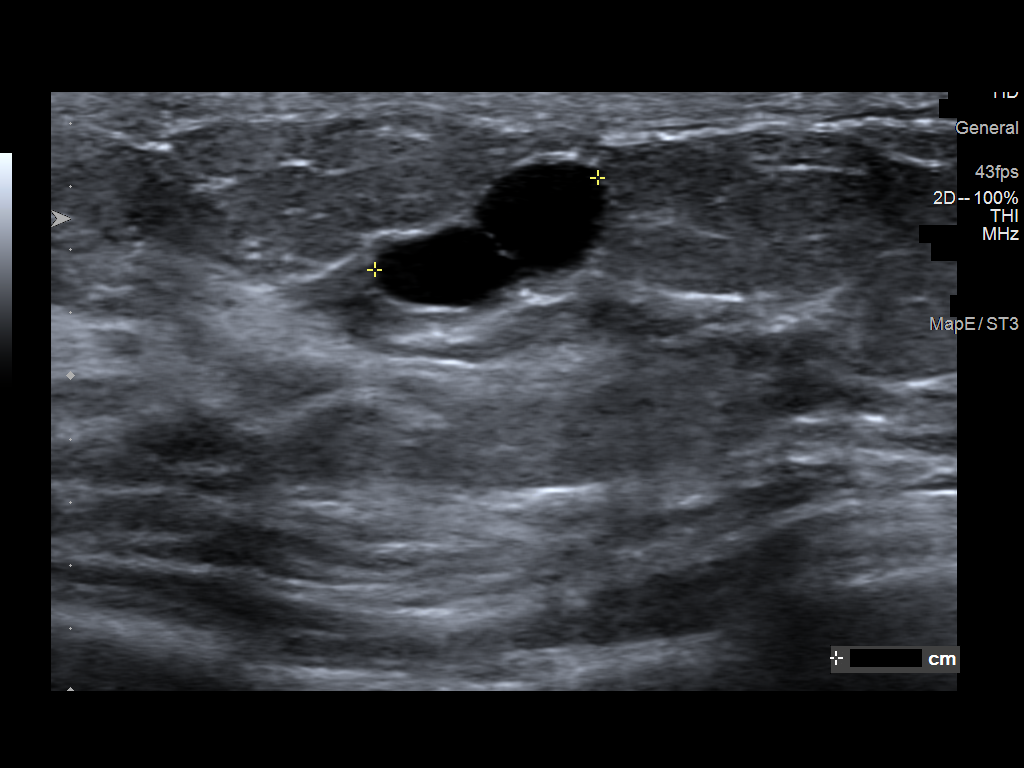
[im 3/7]
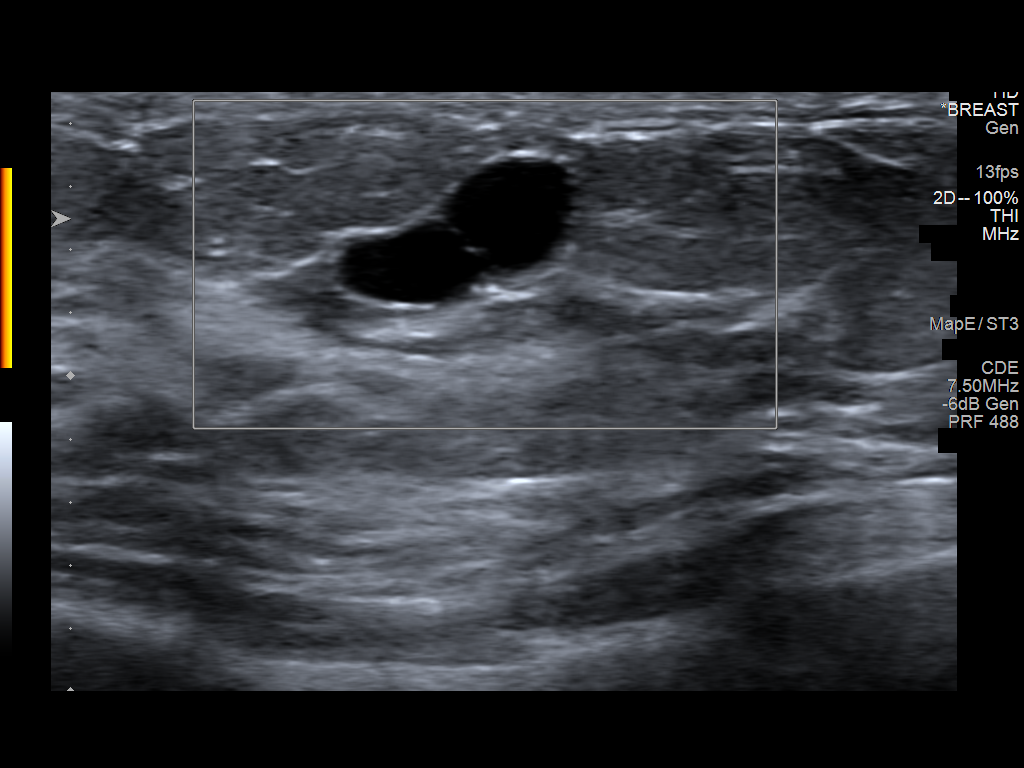
[im 4/7]
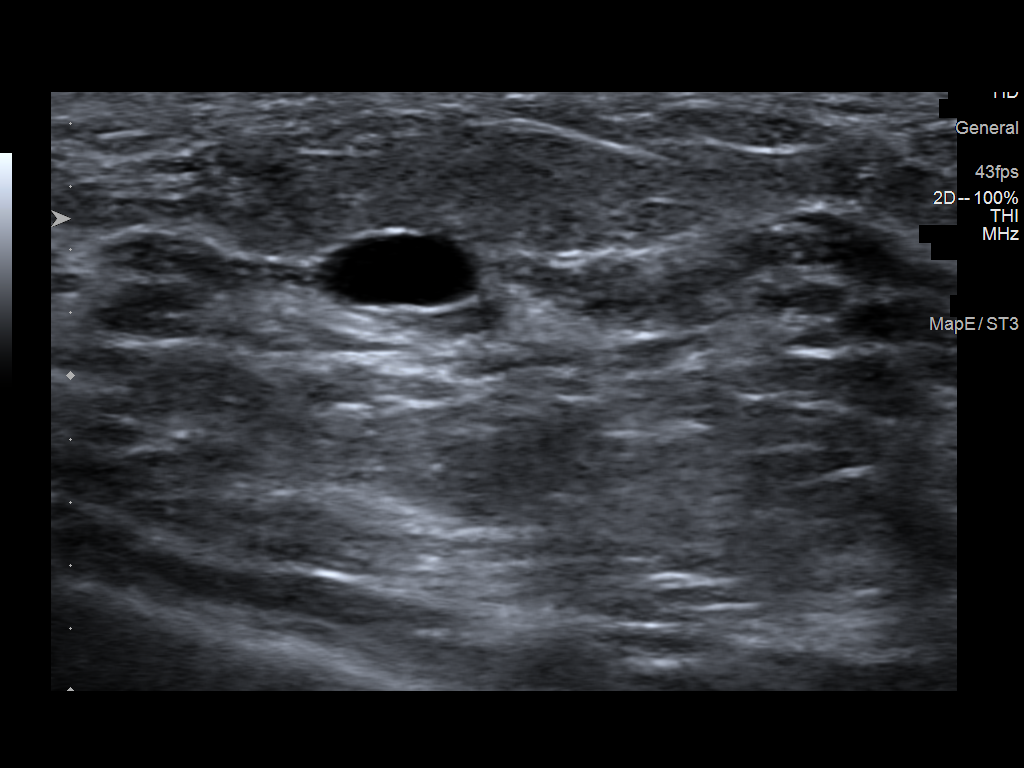
[im 5/7]
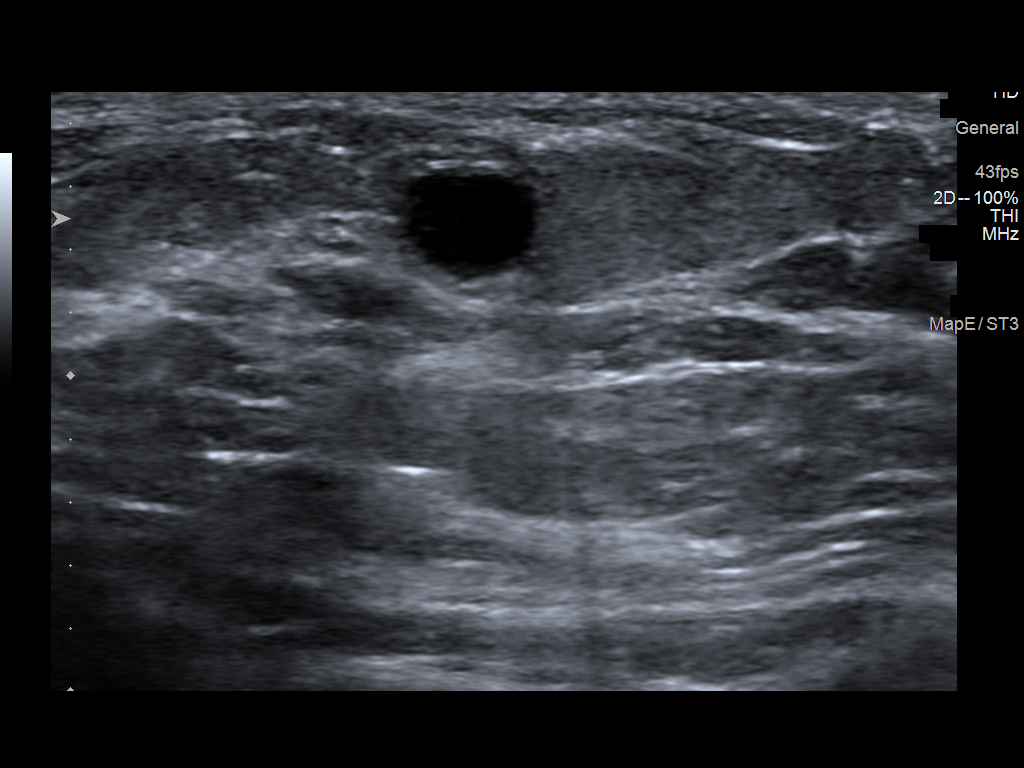
[im 6/7]
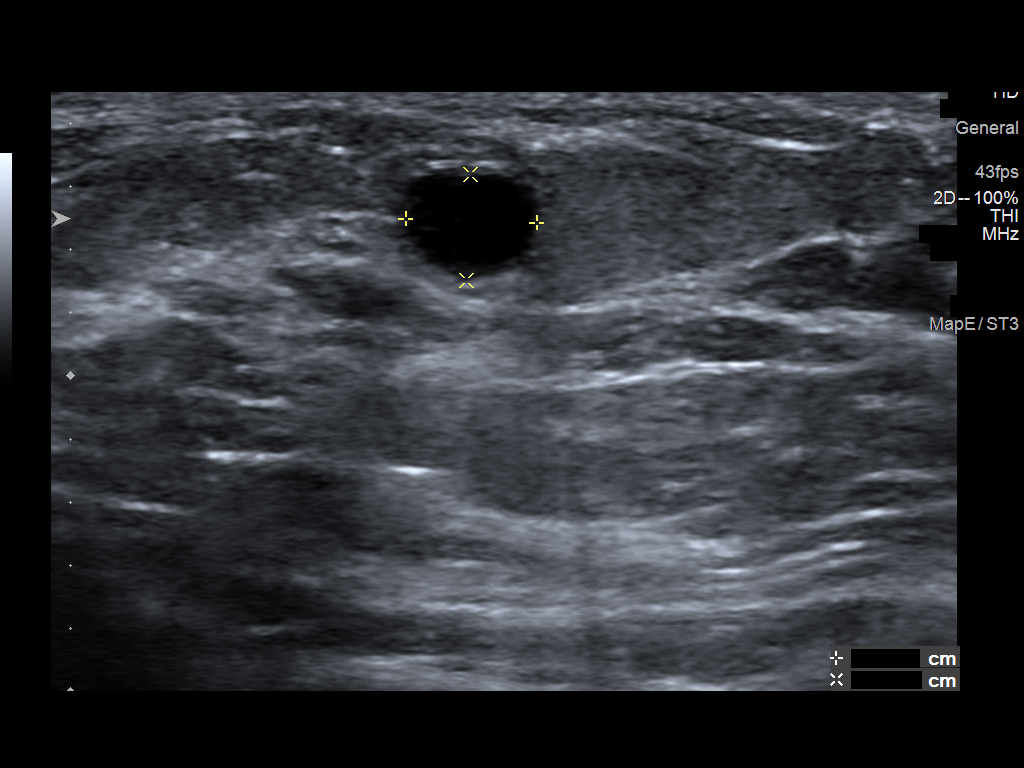
[im 7/7]
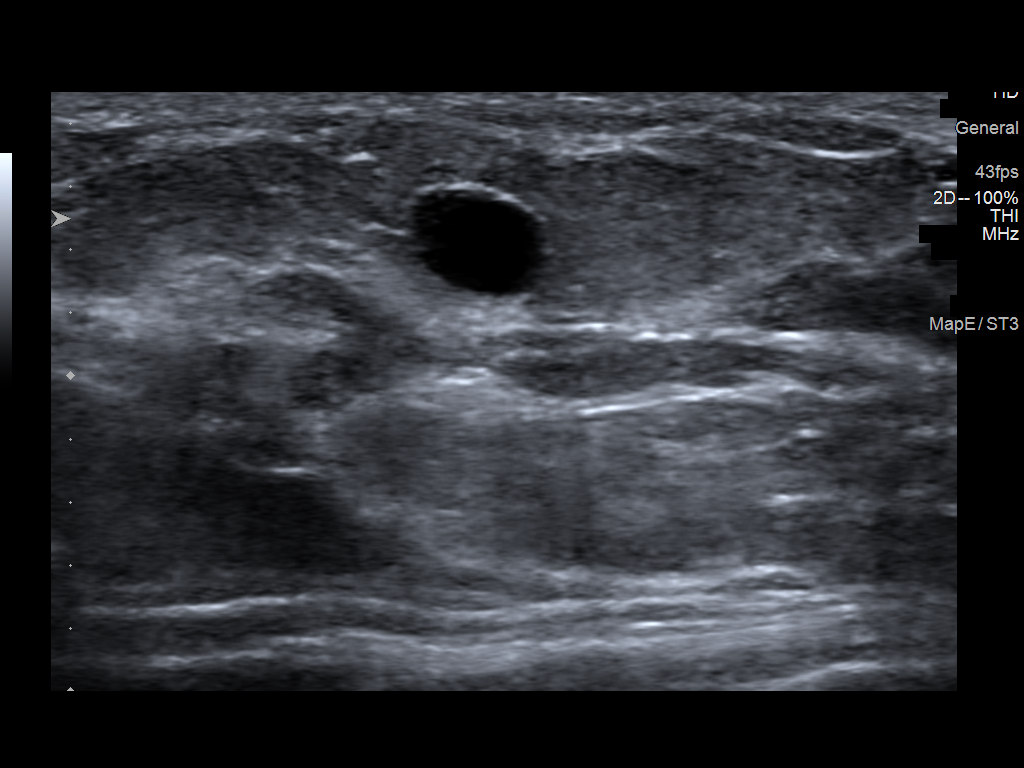

[7 of 7 positions shown; findings below may reference images not displayed]

FINDINGS: Targeted ultrasound is performed, showing a benign cyst in the LEFT
breast at the 6 o'clock axis, 2 cm from the nipple, now measuring 8
mm greatest dimension, increased in size compared to the previous
exam but now anechoic with circumscribed margins confirming it to be
a benign cyst.
IMPRESSION: No evidence of malignancy. Benign cyst in the LEFT breast at the 6
o'clock axis, measuring 8 mm, corresponding to the initial
mammographic finding. No additional follow-up diagnostic imaging is
necessary for this benign finding.

Patient may return to routine annual bilateral screening mammogram
schedule. Next bilateral screening mammogram will be due in [REDACTED]

RECOMMENDATION:
Bilateral screening mammogram in Tuesday October, 2022.

I have discussed the findings and recommendations with the patient.
If applicable, a reminder letter will be sent to the patient
regarding the next appointment.

BI-RADS CATEGORY  2: Benign.

## 2022-09-08 ENCOUNTER — Other Ambulatory Visit: Payer: Self-pay | Admitting: Family Medicine

## 2022-09-08 DIAGNOSIS — Z1231 Encounter for screening mammogram for malignant neoplasm of breast: Secondary | ICD-10-CM

## 2022-10-16 ENCOUNTER — Ambulatory Visit
Admission: RE | Admit: 2022-10-16 | Discharge: 2022-10-16 | Disposition: A | Discharge status: Home / Self Care | Payer: BC Managed Care – PPO | Source: Ambulatory Visit | Attending: Family Medicine | Admitting: Family Medicine

## 2022-10-16 DIAGNOSIS — Z1231 Encounter for screening mammogram for malignant neoplasm of breast: Secondary | ICD-10-CM | POA: Diagnosis not present

## 2022-10-30 ENCOUNTER — Ambulatory Visit (INDEPENDENT_AMBULATORY_CARE_PROVIDER_SITE_OTHER): Payer: BC Managed Care – PPO | Admitting: Family Medicine

## 2022-10-30 ENCOUNTER — Encounter: Payer: Self-pay | Admitting: Family Medicine

## 2022-10-30 VITALS — BP 112/74 | HR 76 | Ht 66.0 in | Wt 122.0 lb

## 2022-10-30 DIAGNOSIS — Z01419 Encounter for gynecological examination (general) (routine) without abnormal findings: Secondary | ICD-10-CM | POA: Diagnosis not present

## 2022-10-30 NOTE — Progress Notes (Signed)
Annual   LMP:10/22/2022 Monthly with a moderate flow.  Last Pap: 09/07/21 ASCUS HPV Negative . Repeat in 77yr.  Mammogram:10/16/22 WNL Repeat in 1 yr  Family Hx of Breast Cancer: NONE STD Screening: Declines  GAD 7= 1 PHQ-9= 1 CC: None

## 2022-10-30 NOTE — Progress Notes (Signed)
   GYNECOLOGY ANNUAL PREVENTATIVE CARE ENCOUNTER NOTE  Subjective:   Kelsey Anthony is a 41 y.o. G22P1011 female here for a routine annual gynecologic exam.  Current complaints: none.     Menses are regular periods every 28-30 days, cramping on day one with need for advil but not heavy or otherwise painful. No recent change. Last < 7d  Denies abnormal vaginal bleeding, discharge, pelvic pain, problems with intercourse or other gynecologic concerns.    Gynecologic History Patient's last menstrual period was 10/22/2022. Contraception: vasectomy Last Pap: 2022 NIL, HPV neg- next in 3 years  Last mammogram: 2023. Results were: normal-- dense breast tissue  Health Maintenance Due  Topic Date Due   COVID-19 Vaccine (1) Never done   Hepatitis C Screening  Never done   TETANUS/TDAP  Never done   INFLUENZA VACCINE  Never done    The following portions of the patient's history were reviewed and updated as appropriate: allergies, current medications, past family history, past medical history, past social history, past surgical history and problem list.  Review of Systems Pertinent items are noted in HPI.   Objective:  BP 112/74   Pulse 76   Ht 5\' 6"  (1.676 m)   Wt 122 lb (55.3 kg)   LMP 10/22/2022   BMI 19.69 kg/m  CONSTITUTIONAL: Well-developed, well-nourished female in no acute distress.  HENT:  Normocephalic, atraumatic, External right and left ear normal. Oropharynx is clear and moist EYES:  No scleral icterus.  NECK: Normal range of motion, supple, no masses.  Normal thyroid.  SKIN: Skin is warm and dry. No rash noted. Not diaphoretic. No erythema. No pallor. NEUROLOGIC: Alert and oriented to person, place, and time. Normal reflexes, muscle tone coordination. No cranial nerve deficit noted. PSYCHIATRIC: Normal mood and affect. Normal behavior. Normal judgment and thought content. CARDIOVASCULAR: Normal heart rate noted, regular rhythm. 2+ distal pulses. RESPIRATORY: Effort  and breath sounds normal, no problems with respiration noted. BREASTS: Symmetric in size. No masses, skin changes, nipple drainage, or lymphadenopathy. Dense tissue. Had a tender 0.56mm mass in right axilla, present during period per patient and then resolves.  ABDOMEN: Soft,  no distention noted.  No tenderness, rebound or guarding.  PELVIC: shared decision to defer exam MUSCULOSKELETAL: Normal range of motion.    Assessment and Plan:  1) Annual gynecologic examination without pap smear:  Will follow up results of pap smear and manage accordingly. STI screening desired No.  Routine preventative health maintenance measures emphasized. Reviewed perimenopausal symptoms and management. Patient brought her yearly lab work from [facility]m and it was reviewed. All values were WNL-- specifically reviewed random Glucose which was 90, Lipids which were WNL and TSH was 4.3 (WNL)  2) Contraception counseling: partner with vasectomy.   3) Axillary mass- patient to monitor and if not resolving will get [employer]  Please refer to After Visit Summary for other counseling recommendations.   Return in about 1 year (around 10/31/2023) for Yearly wellness exam.  11/02/2023, MD, MPH, ABFM Attending Physician Center for [facility]

## 2023-10-17 ENCOUNTER — Other Ambulatory Visit: Payer: Self-pay | Admitting: Family Medicine

## 2023-10-17 DIAGNOSIS — Z1231 Encounter for screening mammogram for malignant neoplasm of breast: Secondary | ICD-10-CM

## 2023-10-19 ENCOUNTER — Ambulatory Visit
Admission: RE | Admit: 2023-10-19 | Discharge: 2023-10-19 | Disposition: A | Discharge status: Home / Self Care | Payer: No Typology Code available for payment source | Source: Ambulatory Visit | Attending: Family Medicine | Admitting: Family Medicine

## 2023-10-19 DIAGNOSIS — Z1231 Encounter for screening mammogram for malignant neoplasm of breast: Secondary | ICD-10-CM

## 2023-10-25 ENCOUNTER — Other Ambulatory Visit: Payer: Self-pay | Admitting: Family Medicine

## 2023-10-25 DIAGNOSIS — R928 Other abnormal and inconclusive findings on diagnostic imaging of breast: Secondary | ICD-10-CM

## 2023-11-08 ENCOUNTER — Ambulatory Visit
Admission: RE | Admit: 2023-11-08 | Discharge: 2023-11-08 | Disposition: A | Discharge status: Home / Self Care | Payer: No Typology Code available for payment source | Source: Ambulatory Visit | Attending: Family Medicine | Admitting: Family Medicine

## 2023-11-08 ENCOUNTER — Ambulatory Visit: Payer: No Typology Code available for payment source

## 2023-11-08 DIAGNOSIS — R928 Other abnormal and inconclusive findings on diagnostic imaging of breast: Secondary | ICD-10-CM

## 2023-11-26 ENCOUNTER — Other Ambulatory Visit: Payer: No Typology Code available for payment source

## 2024-04-18 ENCOUNTER — Ambulatory Visit (INDEPENDENT_AMBULATORY_CARE_PROVIDER_SITE_OTHER): Admitting: Family Medicine

## 2024-04-18 ENCOUNTER — Other Ambulatory Visit (HOSPITAL_COMMUNITY)
Admission: RE | Admit: 2024-04-18 | Discharge: 2024-04-18 | Disposition: A | Discharge status: Home / Self Care | Source: Ambulatory Visit | Attending: Family Medicine | Admitting: Family Medicine

## 2024-04-18 VITALS — BP 111/74 | HR 77 | Ht 66.0 in | Wt 127.2 lb

## 2024-04-18 DIAGNOSIS — N939 Abnormal uterine and vaginal bleeding, unspecified: Secondary | ICD-10-CM

## 2024-04-18 DIAGNOSIS — Z01419 Encounter for gynecological examination (general) (routine) without abnormal findings: Secondary | ICD-10-CM

## 2024-04-18 DIAGNOSIS — Z124 Encounter for screening for malignant neoplasm of cervix: Secondary | ICD-10-CM

## 2024-04-18 NOTE — Progress Notes (Signed)
 Patient presents for Annual.  LMP: Patient's last menstrual period was 04/13/2024 (approximate).  Last pap: Date: 2022-ASCUS  Contraception:  Husband vas.  Mammogram: Up to date: 11/08/23 STD Screening: Declines Flu Vaccine : N/A  CC:  Annual  Period before last one, was 2 weeks early , different and lighter and this most recent was heavier

## 2024-04-18 NOTE — Progress Notes (Signed)
 GYNECOLOGY ANNUAL PREVENTATIVE CARE ENCOUNTER NOTE  Subjective:   Kelsey Anthony is a 43 y.o. G50P1011 female here for a routine annual gynecologic exam.  Current complaints: Menstrual irregularity.     AUB: Menses are regular but has noted a change in bleeding. Her more recent period was very heavy. She reports the menses prior to this was lighter and two weeks early. This cycle was more like spotting everyday for about 4 days. Her more recent cycle was heavy/moderate for 4 days. She reports her typical and regular cycle is 3-4 days with 1 day of moderate to heavy bleeding on day one.   Denies abnormal discharge, pelvic pain, problems with intercourse or other gynecologic concerns.    Gynecologic History Patient's last menstrual period was 04/13/2024 (approximate). Contraception: vasectomy Last Pap: 2022. Results were: ASCUS HPV negative, repeat today Last mammogram: 2024- November. Results were: normal but dense tissue  Health Maintenance Due  Topic Date Due   Hepatitis C Screening  Never done   DTaP/Tdap/Td (1 - Tdap) Never done   COVID-19 Vaccine (1 - 2024-25 season) Never done    The following portions of the patient's history were reviewed and updated as appropriate: allergies, current medications, past family history, past medical history, past social history, past surgical history and problem list.  Review of Systems Pertinent items are noted in HPI.   Objective:  BP 111/74   Pulse 77   Ht 5\' 6"  (1.676 m)   Wt 127 lb 3.2 oz (57.7 kg)   LMP 04/13/2024 (Approximate)   BMI 20.53 kg/m   Wt Readings from Last 3 Encounters:  04/18/24 127 lb 3.2 oz (57.7 kg)  10/30/22 122 lb (55.3 kg)  09/07/21 118 lb (53.5 kg)    CONSTITUTIONAL: Well-developed, well-nourished female in no acute distress.  HENT:  Normocephalic, atraumatic, External right and left ear normal. Oropharynx is clear and moist EYES:  No scleral icterus.  NECK: Normal range of motion, supple, no masses.   Normal thyroid.  SKIN: Skin is warm and dry. No rash noted. Not diaphoretic. No erythema. No pallor. NEUROLOGIC: Alert and oriented to person, place, and time. Normal reflexes, muscle tone coordination. No cranial nerve deficit noted. PSYCHIATRIC: Normal mood and affect. Normal behavior. Normal judgment and thought content. CARDIOVASCULAR: Normal heart rate noted, regular rhythm. 2+ distal pulses. RESPIRATORY: Effort and breath sounds normal, no problems with respiration noted. BREASTS: Symmetric in size. No masses, skin changes, nipple drainage, or lymphadenopathy. ABDOMEN: Soft,  no distention noted.  No tenderness, rebound or guarding.  PELVIC: Normal appearing external genitalia; normal appearing vaginal mucosa and cervix.  No abnormal discharge noted.  Pap smear obtained.  Normal uterine size, no other palpable masses, no uterine or adnexal tenderness. Chaperone present for exam MUSCULOSKELETAL: Normal range of motion.    Assessment and Plan:  1) Annual gynecologic examination with pap smear:  Will follow up results of pap smear and manage accordingly. STI screening desired No.  Routine preventative health maintenance measures emphasized. Reviewed perimenopausal symptoms and management.   2) Contraception counseling: partner with vasectomy  1. Well woman exam with routine gynecological exam (Primary) - Cytology - PAP - Hemoglobin A1c - Comprehensive metabolic panel with GFR - Lipid panel - VITAMIN D 25 Hydroxy (Vit-D Deficiency, Fractures) - MM 3D SCREENING MAMMOGRAM BILATERAL BREAST; Future  2. Cervical cancer screening  3. Abnormal uterine bleeding (AUB)--99214 Do not feel US  warranted current Will get metabolic labs today Consider US  if not improved or continue pattern -  TSH Rfx on Abnormal to Free T4 - Hemoglobin A1c - CBC - VITAMIN D 25 Hydroxy (Vit-D Deficiency, Fractures)   Please refer to After Visit Summary for other counseling recommendations.   No follow-ups on  file.  Abner Ables, MD, MPH, ABFM Attending Physician Center for [facility]

## 2024-04-19 LAB — HEMOGLOBIN A1C
Est. average glucose Bld gHb Est-mCnc: 97 mg/dL
Hgb A1c MFr Bld: 5 % (ref 4.8–5.6)

## 2024-04-19 LAB — COMPREHENSIVE METABOLIC PANEL WITH GFR
ALT: 16 IU/L (ref 0–32)
AST: 18 IU/L (ref 0–40)
Albumin: 4.8 g/dL (ref 3.9–4.9)
Alkaline Phosphatase: 51 IU/L (ref 44–121)
BUN/Creatinine Ratio: 16 (ref 9–23)
BUN: 13 mg/dL (ref 6–24)
Bilirubin Total: 0.3 mg/dL (ref 0.0–1.2)
CO2: 22 mmol/L (ref 20–29)
Calcium: 9.4 mg/dL (ref 8.7–10.2)
Chloride: 103 mmol/L (ref 96–106)
Creatinine, Ser: 0.79 mg/dL (ref 0.57–1.00)
Globulin, Total: 2.5 g/dL (ref 1.5–4.5)
Glucose: 85 mg/dL (ref 70–99)
Potassium: 4 mmol/L (ref 3.5–5.2)
Sodium: 139 mmol/L (ref 134–144)
Total Protein: 7.3 g/dL (ref 6.0–8.5)
eGFR: 95 mL/min/1.73 (ref >59)

## 2024-04-19 LAB — CBC
Hematocrit: 39.6 % (ref 34.0–46.6)
Hemoglobin: 12.9 g/dL (ref 11.1–15.9)
MCH: 28.9 pg (ref 26.6–33.0)
MCHC: 32.6 g/dL (ref 31.5–35.7)
MCV: 89 fL (ref 79–97)
Platelets: 237 10*3/uL (ref 150–450)
RBC: 4.46 x10E6/uL (ref 3.77–5.28)
RDW: 12.2 % (ref 11.7–15.4)
WBC: 3.8 10*3/uL (ref 3.4–10.8)

## 2024-04-19 LAB — LIPID PANEL
Chol/HDL Ratio: 2.4 ratio (ref 0.0–4.4)
Cholesterol, Total: 169 mg/dL (ref 100–199)
HDL: 70 mg/dL (ref >39)
LDL Chol Calc (NIH): 89 mg/dL (ref 0–99)
Triglycerides: 48 mg/dL (ref 0–149)
VLDL Cholesterol Cal: 10 mg/dL (ref 5–40)

## 2024-04-19 LAB — T4F: T4,Free (Direct): 1.18 ng/dL (ref 0.82–1.77)

## 2024-04-19 LAB — VITAMIN D 25 HYDROXY (VIT D DEFICIENCY, FRACTURES): Vit D, 25-Hydroxy: 33.6 ng/mL (ref 30.0–100.0)

## 2024-04-19 LAB — TSH RFX ON ABNORMAL TO FREE T4: TSH: 4.92 u[IU]/mL — ABNORMAL HIGH (ref 0.450–4.500)

## 2024-04-20 ENCOUNTER — Encounter: Payer: Self-pay | Admitting: Family Medicine

## 2024-04-23 LAB — SPECIMEN STATUS REPORT

## 2024-04-23 LAB — THYROID PEROXIDASE ANTIBODY: Thyroperoxidase Ab SerPl-aCnc: 11 [IU]/mL (ref 0–34)

## 2024-04-25 LAB — CYTOLOGY - PAP
Comment: NEGATIVE
Diagnosis: NEGATIVE
High risk HPV: NEGATIVE

## 2024-11-10 ENCOUNTER — Ambulatory Visit
Admission: RE | Admit: 2024-11-10 | Discharge: 2024-11-10 | Disposition: A | Source: Ambulatory Visit | Attending: Family Medicine

## 2024-11-10 DIAGNOSIS — Z01419 Encounter for gynecological examination (general) (routine) without abnormal findings: Secondary | ICD-10-CM

## 2024-11-17 ENCOUNTER — Ambulatory Visit: Payer: Self-pay | Admitting: Family Medicine

## 2024-12-19 ENCOUNTER — Ambulatory Visit
Admission: EM | Admit: 2024-12-19 | Discharge: 2024-12-19 | Disposition: A | Discharge status: Home / Self Care | Attending: Emergency Medicine | Admitting: Emergency Medicine

## 2024-12-19 ENCOUNTER — Encounter: Payer: Self-pay | Admitting: Emergency Medicine

## 2024-12-19 DIAGNOSIS — J069 Acute upper respiratory infection, unspecified: Secondary | ICD-10-CM | POA: Diagnosis not present

## 2024-12-19 MED ORDER — AZITHROMYCIN 200 MG/5ML PO SUSR: ORAL | 0 refills | Status: AC

## 2024-12-19 NOTE — ED Triage Notes (Signed)
 Patient reports, cough, chest congestion and sob x 1 week. Patient has taken OTC cough syrup and Ibuprofen  with mild relief.

## 2024-12-19 NOTE — Discharge Instructions (Addendum)
 Today you are evaluated for your upper respiratory symptoms  Take azithromycin  as directed for treatment of bacteria that may be causing symptoms to linger  You can take Tylenol  and/or Ibuprofen  as needed for fever reduction and pain relief.   For cough: honey 1/2 to 1 teaspoon (you can dilute the honey in water or another fluid).  You can also use guaifenesin and dextromethorphan for cough. You can use a humidifier for chest congestion and cough.  If you don't have a humidifier, you can sit in the bathroom with the hot shower running.      For sore throat: try warm salt water gargles, cepacol lozenges, throat spray, warm tea or water with lemon/honey, popsicles or ice, or OTC cold relief medicine for throat discomfort.   For congestion: take a daily anti-histamine like Zyrtec, Claritin, and a oral decongestant, such as pseudoephedrine.  You can also use Flonase 1-2 sprays in each nostril daily.   It is important to stay hydrated: drink plenty of fluids (water, gatorade/powerade/pedialyte, juices, or teas) to keep your throat moisturized and help further relieve irritation/discomfort.

## 2024-12-19 NOTE — ED Provider Notes (Signed)
 " Kelsey Anthony    CSN: [id number] Arrival date & time: 12/19/24  9175      History   Chief Complaint Chief Complaint  Patient presents with   Shortness of Breath   Cough    HPI Kelsey Anthony is a 44 y.o. female.   Patient presents for evaluation of fever, nasal congestion, primarily nonproductive cough, with clear sputum present when able to expel, shortness of breath with exertion and nausea and vomiting present for 9 days.  Fever nausea and vomiting have resolved.  Has a decreased appetite but able to tolerate some food and fluids.  No known sick contacts prior.  Denies respiratory history, non-smoker.  Has attempted use of Delsym and ibuprofen .  Past Medical History:  Diagnosis Date   Anencephaly in pregnancy 2015   HLD (hyperlipidemia)    borderline   Ocular migraine 07/2013   TIA (transient ischemic attack) 7/22 and 730/2014    Patient Active Problem List   Diagnosis Date Noted   Bowel habit changes 06/29/2016   Grieving 04/13/2014   History of fetal anencephaly in prior pregnancy; induced at [redacted]w[redacted]d 03/27/2014   Subclinical hypothyroidism 09/04/2013   Ocular migraine 07/16/2013    Past Surgical History:  Procedure Laterality Date   DILATION AND EVACUATION N/A 05/07/2014   Procedure: DILATATION AND EVACUATION;  Surgeon: Kelsey KANDICE Solomons, MD;  Location: [facility];  Service: Gynecology;  Laterality: N/A;   induction of labor  2015   17 wk anencephaly    OB History     Gravida  3   Para  2   Term  1   Preterm  0   AB  1   Living  1      SAB  0   IAB  1   Ectopic  0   Multiple  0   Live Births  1            Home Medications    Prior to Admission medications  Medication Sig Start Date End Date Taking? Authorizing Provider  Multiple Vitamins-Calcium (ONE-A-DAY WOMENS PO) Take 1 tablet by mouth daily.     [provider]    Family History Family History  Problem Relation Age of Onset   Depression Mother    Mental  illness Mother    Depression Sister    Heart disease Maternal Grandmother        open heart surgery   Diabetes Maternal Grandmother    Cancer Maternal Grandfather        prostate s/p seeds   Diabetes Paternal Grandmother    Hypertension Father    Hyperlipidemia Neg Hx    Stroke Neg Hx     Social History Social History[1]   Allergies   Amoxicillin and Penicillins   Review of Systems Review of Systems  Constitutional:  Positive for fever. Negative for activity change, appetite change, chills, diaphoresis, fatigue and unexpected weight change.  HENT:  Positive for congestion. Negative for dental problem, drooling, ear discharge, ear pain, facial swelling, hearing loss, mouth sores, nosebleeds, postnasal drip, rhinorrhea, sinus pressure, sinus pain, sneezing, sore throat, tinnitus, trouble swallowing and voice change.   Respiratory:  Positive for cough and shortness of breath. Negative for apnea, choking, chest tightness, wheezing and stridor.   Gastrointestinal:  Positive for nausea. Negative for abdominal distention, abdominal pain, anal bleeding, blood in stool, constipation, diarrhea, rectal pain and vomiting.     Physical Exam Triage Vital Signs ED Triage Vitals  Encounter Vitals  Group     BP 12/19/24 0859 121/83     Girls Systolic BP Percentile --      Girls Diastolic BP Percentile --      Boys Systolic BP Percentile --      Boys Diastolic BP Percentile --      Pulse Rate 12/19/24 0859 90     Resp 12/19/24 0859 17     Temp 12/19/24 0859 98 F (36.7 C)     Temp Source 12/19/24 0859 Oral     SpO2 12/19/24 0859 99 %     Weight --      Height --      Head Circumference --      Peak Flow --      Pain Score 12/19/24 0902 0     Pain Loc --      Pain Education --      Exclude from Growth Chart --    No data found.  Updated Vital Signs BP 121/83 (BP Location: Right Arm)   Pulse 90   Temp 98 F (36.7 C) (Oral)   Resp 17   LMP 12/02/2024 (Exact Date)   SpO2 99%    Visual Acuity Right Eye Distance:   Left Eye Distance:   Bilateral Distance:    Right Eye Near:   Left Eye Near:    Bilateral Near:     Physical Exam Constitutional:      Appearance: Normal appearance.  HENT:     Right Ear: Tympanic membrane, ear canal and external ear normal.     Left Ear: Tympanic membrane, ear canal and external ear normal.     Nose: Congestion present.     Mouth/Throat:     Pharynx: No oropharyngeal exudate or posterior oropharyngeal erythema.  Eyes:     Extraocular Movements: Extraocular movements intact.  Cardiovascular:     Rate and Rhythm: Normal rate and regular rhythm.     Pulses: Normal pulses.     Heart sounds: Normal heart sounds.  Pulmonary:     Effort: Pulmonary effort is normal.     Breath sounds: Normal breath sounds.  Musculoskeletal:     Cervical back: Normal range of motion.  Lymphadenopathy:     Cervical: No cervical adenopathy.  Neurological:     Mental Status: She is alert and oriented to person, place, and time. Mental status is at baseline.      UC Treatments / Results  Labs (all labs ordered are listed, but only abnormal results are displayed) Labs Reviewed - No data to display  EKG   Radiology No results found.  Procedures Procedures (including critical care time)  Medications Ordered in UC Medications - No data to display  Initial Impression / Assessment and Plan / UC Course  I have reviewed the triage vital signs and the nursing notes.  Pertinent labs & imaging results that were available during my care of the patient were reviewed by me and considered in my medical decision making (see chart for details).  Acute URI  Patient is in no signs of distress nor toxic appearing.  Vital signs are stable.  Low suspicion for pneumonia, pneumothorax or bronchitis and therefore will defer imaging.  Prescribed azithromycin  declined inhaler and cough medicine.May use additional over-the-counter medications as needed for  supportive care.  May follow-up with urgent care as needed if symptoms persist or worsen.   Final Clinical Impressions(s) / UC Diagnoses   Final diagnoses:  None   Discharge Instructions   None  ED Prescriptions   None    PDMP not reviewed this encounter.     [1]  Social History Tobacco Use   Smoking status: Never   Smokeless tobacco: Never  Vaping Use   Vaping status: Never Used  Substance Use Topics   Alcohol use: Yes    Alcohol/week: 1.0 standard drink of alcohol    Types: 1 Glasses of wine per week    Comment: Rare/social   Drug use: No     Teresa Shelba SAUNDERS, NP 12/19/24 [phone]  "

## 2025-01-05 ENCOUNTER — Encounter: Payer: Self-pay | Admitting: Family Medicine

## 2025-02-06 ENCOUNTER — Ambulatory Visit: Admitting: Family Medicine
